# Patient Record
Sex: Female | Born: 1935
Health system: Southern US, Community
[De-identification: ages and names within clinical notes are randomized; demographics above are authoritative.]

## PROBLEM LIST (undated history)

## (undated) DIAGNOSIS — R002 Palpitations: Secondary | ICD-10-CM

## (undated) DIAGNOSIS — K219 Gastro-esophageal reflux disease without esophagitis: Secondary | ICD-10-CM

## (undated) DIAGNOSIS — I480 Paroxysmal atrial fibrillation: Secondary | ICD-10-CM

## (undated) DIAGNOSIS — I4891 Unspecified atrial fibrillation: Secondary | ICD-10-CM

## (undated) DIAGNOSIS — I499 Cardiac arrhythmia, unspecified: Secondary | ICD-10-CM

## (undated) DIAGNOSIS — C801 Malignant (primary) neoplasm, unspecified: Secondary | ICD-10-CM

## (undated) DIAGNOSIS — H9190 Unspecified hearing loss, unspecified ear: Secondary | ICD-10-CM

## (undated) DIAGNOSIS — R918 Other nonspecific abnormal finding of lung field: Secondary | ICD-10-CM

## (undated) HISTORY — PX: ABDOMINAL HYSTERECTOMY: SHX81

---

## 2003-11-16 ENCOUNTER — Ambulatory Visit: Payer: Self-pay | Admitting: Unknown Physician Specialty

## 2004-08-15 ENCOUNTER — Ambulatory Visit: Payer: Self-pay | Admitting: Gastroenterology

## 2004-11-26 ENCOUNTER — Ambulatory Visit: Payer: Self-pay | Admitting: Unknown Physician Specialty

## 2004-12-04 ENCOUNTER — Ambulatory Visit: Payer: Self-pay | Admitting: Unknown Physician Specialty

## 2005-06-17 ENCOUNTER — Ambulatory Visit: Payer: Self-pay | Admitting: Internal Medicine

## 2005-06-26 ENCOUNTER — Ambulatory Visit: Payer: Self-pay | Admitting: Unknown Physician Specialty

## 2005-12-10 ENCOUNTER — Ambulatory Visit: Payer: Self-pay | Admitting: Internal Medicine

## 2006-11-26 ENCOUNTER — Ambulatory Visit: Payer: Self-pay | Admitting: Internal Medicine

## 2006-12-17 ENCOUNTER — Ambulatory Visit: Payer: Self-pay | Admitting: Internal Medicine

## 2007-01-13 ENCOUNTER — Ambulatory Visit: Payer: Self-pay | Admitting: Gastroenterology

## 2008-01-05 ENCOUNTER — Ambulatory Visit: Payer: Self-pay | Admitting: Internal Medicine

## 2008-03-30 ENCOUNTER — Ambulatory Visit: Payer: Self-pay | Admitting: Unknown Physician Specialty

## 2008-05-04 ENCOUNTER — Ambulatory Visit: Payer: Self-pay | Admitting: Pain Medicine

## 2008-05-05 ENCOUNTER — Ambulatory Visit: Payer: Self-pay | Admitting: Pain Medicine

## 2008-05-24 ENCOUNTER — Ambulatory Visit: Payer: Self-pay | Admitting: Physician Assistant

## 2009-01-10 ENCOUNTER — Ambulatory Visit: Payer: Self-pay | Admitting: Internal Medicine

## 2009-05-16 ENCOUNTER — Ambulatory Visit: Payer: Self-pay | Admitting: Internal Medicine

## 2010-02-19 ENCOUNTER — Ambulatory Visit: Payer: Self-pay | Admitting: Internal Medicine

## 2010-07-03 ENCOUNTER — Ambulatory Visit: Payer: Self-pay | Admitting: Internal Medicine

## 2010-07-13 ENCOUNTER — Ambulatory Visit: Payer: Self-pay | Admitting: Internal Medicine

## 2010-08-12 ENCOUNTER — Ambulatory Visit: Payer: Self-pay | Admitting: Internal Medicine

## 2010-09-12 ENCOUNTER — Ambulatory Visit: Payer: Self-pay | Admitting: Internal Medicine

## 2011-03-26 ENCOUNTER — Ambulatory Visit: Payer: Self-pay | Admitting: Internal Medicine

## 2011-03-29 ENCOUNTER — Ambulatory Visit: Payer: Self-pay | Admitting: Internal Medicine

## 2011-04-03 ENCOUNTER — Ambulatory Visit: Payer: Self-pay | Admitting: Internal Medicine

## 2012-03-31 ENCOUNTER — Ambulatory Visit: Payer: Self-pay | Admitting: Internal Medicine

## 2013-04-19 ENCOUNTER — Ambulatory Visit: Payer: Self-pay | Admitting: Physician Assistant

## 2013-05-04 ENCOUNTER — Ambulatory Visit: Payer: Self-pay | Admitting: Physician Assistant

## 2014-04-19 DIAGNOSIS — K219 Gastro-esophageal reflux disease without esophagitis: Secondary | ICD-10-CM | POA: Diagnosis not present

## 2014-04-19 DIAGNOSIS — Z0001 Encounter for general adult medical examination with abnormal findings: Secondary | ICD-10-CM | POA: Diagnosis not present

## 2014-04-19 DIAGNOSIS — R300 Dysuria: Secondary | ICD-10-CM | POA: Diagnosis not present

## 2014-05-10 DIAGNOSIS — Z1231 Encounter for screening mammogram for malignant neoplasm of breast: Secondary | ICD-10-CM | POA: Diagnosis not present

## 2014-05-26 DIAGNOSIS — R319 Hematuria, unspecified: Secondary | ICD-10-CM | POA: Diagnosis not present

## 2014-07-05 DIAGNOSIS — H2513 Age-related nuclear cataract, bilateral: Secondary | ICD-10-CM | POA: Diagnosis not present

## 2014-09-30 ENCOUNTER — Emergency Department
Admission: EM | Admit: 2014-09-30 | Discharge: 2014-09-30 | Disposition: A | Discharge status: Home / Self Care | Payer: Commercial Managed Care - HMO | Attending: Emergency Medicine | Admitting: Emergency Medicine

## 2014-09-30 ENCOUNTER — Encounter: Payer: Self-pay | Admitting: Emergency Medicine

## 2014-09-30 DIAGNOSIS — Z87891 Personal history of nicotine dependence: Secondary | ICD-10-CM | POA: Diagnosis not present

## 2014-09-30 DIAGNOSIS — J029 Acute pharyngitis, unspecified: Secondary | ICD-10-CM | POA: Diagnosis not present

## 2014-09-30 HISTORY — DX: Gastro-esophageal reflux disease without esophagitis: K21.9

## 2014-09-30 LAB — POCT RAPID STREP A: STREPTOCOCCUS, GROUP A SCREEN (DIRECT): NEGATIVE

## 2014-09-30 MED ORDER — LIDOCAINE VISCOUS 2 % MT SOLN
15.0000 mL | Freq: Once | OROMUCOSAL | Status: AC
  Administered 2014-09-30: 15 mL via OROMUCOSAL
  Filled 2014-09-30: qty 15

## 2014-09-30 MED ORDER — MAGIC MOUTHWASH W/LIDOCAINE: 5.0000 mL | Freq: Four times a day (QID) | ORAL | Status: DC | PRN

## 2014-09-30 NOTE — Discharge Instructions (Signed)
Pharyngitis Pharyngitis is redness, pain, and swelling (inflammation) of your pharynx.  CAUSES  Pharyngitis is usually caused by infection. Most of the time, these infections are from viruses (viral) and are part of a cold. However, sometimes pharyngitis is caused by bacteria (bacterial). Pharyngitis can also be caused by allergies. Viral pharyngitis may be spread from person to person by coughing, sneezing, and personal items or utensils (cups, forks, spoons, toothbrushes). Bacterial pharyngitis may be spread from person to person by more intimate contact, such as kissing.  SIGNS AND SYMPTOMS  Symptoms of pharyngitis include:   Sore throat.   Tiredness (fatigue).   Low-grade fever.   Headache.  Joint pain and muscle aches.  Skin rashes.  Swollen lymph nodes.  Plaque-like film on throat or tonsils (often seen with bacterial pharyngitis). DIAGNOSIS  Your health care provider will ask you questions about your illness and your symptoms. Your medical history, along with a physical exam, is often all that is needed to diagnose pharyngitis. Sometimes, a rapid strep test is done. Other lab tests may also be done, depending on the suspected cause.  TREATMENT  Viral pharyngitis will usually get better in 3-4 days without the use of medicine. Bacterial pharyngitis is treated with medicines that kill germs (antibiotics).  HOME CARE INSTRUCTIONS   Drink enough water and fluids to keep your urine clear or pale yellow.   Only take over-the-counter or prescription medicines as directed by your health care provider:   If you are prescribed antibiotics, make sure you finish them even if you start to feel better.   Do not take aspirin.   Get lots of rest.   Gargle with 8 oz of salt water ( tsp of salt per 1 qt of water) as often as every 1-2 hours to soothe your throat.   Throat lozenges (if you are not at risk for choking) or sprays may be used to soothe your throat. SEEK MEDICAL  CARE IF:   You have large, tender lumps in your neck.  You have a rash.  You cough up green, yellow-brown, or bloody spit. SEEK IMMEDIATE MEDICAL CARE IF:   Your neck becomes stiff.  You drool or are unable to swallow liquids.  You vomit or are unable to keep medicines or liquids down.  You have severe pain that does not go away with the use of recommended medicines.  You have trouble breathing (not caused by a stuffy nose). MAKE SURE YOU:   Understand these instructions.  Will watch your condition.  Will get help right away if you are not doing well or get worse. Document Released: 01/28/2005 Document Revised: 11/18/2012 Document Reviewed: 10/05/2012 [facility] Patient Information 2015 [place], [place]. This information is not intended to replace advice given to you by your health care provider. Make sure you discuss any questions you have with your health care provider.  Your rapid strep test was normal today. Use the prescription mouthwash as needed. Follow-up with your provider as needed. Consider dosing your daily allergy medicine to reduce post-nasal drainage.

## 2014-09-30 NOTE — ED Provider Notes (Signed)
[facility] Emergency Department Provider Note ____________________________________________  Time seen: 1825  I have reviewed the triage vital signs and the nursing notes.  HISTORY  Chief Complaint  Sore Throat  HPI Carrie Rodriguez is a 81 y.o. female reports to the ED for evaluation of a 3 day complaint of sore throat pain that has worsened over the last 2 days.She denies fevers, chills, sweats, or URI symptoms. She was initially evaluated at the [facility] clinic, but was sent here after a negative strep test.  Past Medical History  Diagnosis Date  . GERD (gastroesophageal reflux disease)    There are no active problems to display for this patient.  History reviewed. No pertinent past surgical history.  Current Outpatient Rx  Name  Route  Sig  Dispense  Refill  . Alum & Mag Hydroxide-Simeth (MAGIC MOUTHWASH W/LIDOCAINE) SOLN   Oral   Take 5 mLs by mouth 4 (four) times daily as needed for mouth pain.   150 mL   0     Prepare [facility] Magic Mouthwash formula with lidoca ...    Allergies Review of patient's allergies indicates no known allergies.  No family history on file.  Social History Social History  Substance Use Topics  . Smoking status: Former Research scientist (life sciences)  . Smokeless tobacco: None  . Alcohol Use: None   Review of Systems  Constitutional: Negative for fever. Eyes: Negative for visual changes. ENT: Positive for sore throat. Cardiovascular: Negative for chest pain. Respiratory: Negative for shortness of breath. Gastrointestinal: Negative for abdominal pain, vomiting and diarrhea. Genitourinary: Negative for dysuria. Musculoskeletal: Negative for back pain. Skin: Negative for rash. Neurological: Negative for headaches, focal weakness or numbness. ____________________________________________  PHYSICAL EXAM:  VITAL SIGNS: ED Triage Vitals  Enc Vitals Group     BP 09/30/14 1558 142/52 mmHg     Pulse Rate 09/30/14 1558 81     Resp  09/30/14 1558 18     Temp 09/30/14 1558 98.3 F (36.8 C)     Temp Source 09/30/14 1558 Oral     SpO2 09/30/14 1558 96 %     Weight 09/30/14 1558 128 lb (58.06 kg)     Height 09/30/14 1558 5\' 4"  (1.626 m)     Head Cir --      Peak Flow --      Pain Score 09/30/14 1603 10     Pain Loc --      Pain Edu? --      Excl. in [place]? --    Constitutional: Alert and oriented. Well appearing and in no distress. Eyes: Conjunctivae are normal. PERRL. Normal extraocular movements. ENT   Head: Normocephalic and atraumatic.   Nose: No congestion/rhinnorhea.   Mouth/Throat: Mucous membranes are moist. Uvula midline. Tonsils not visualized. Posterior pharynx injected, with cobblestone appearance.    Neck: Supple. No thyromegaly. Hematological/Lymphatic/Immunilogical: No cervical lymphadenopathy. Cardiovascular: Normal rate, regular rhythm.  Respiratory: Normal respiratory effort. No wheezes/rales/rhonchi. Gastrointestinal: Soft and nontender. No distention. Musculoskeletal: Nontender with normal range of motion in all extremities.  Neurologic:  Normal gait without ataxia. Normal speech and language. No gross focal neurologic deficits are appreciated. Skin:  Skin is warm, dry and intact. No rash noted. Psychiatric: Mood and affect are normal. Patient exhibits appropriate insight and judgment. ____________________________________________   LABS (pertinent positives/negatives) Labs Reviewed  POCT RAPID STREP A  ____________________________________________  PROCEDURES  2% viscous lido gargle ____________________________________________  INITIAL IMPRESSION / ASSESSMENT AND PLAN / ED COURSE  Acute pharyngitis. No clinical evidence  of tonsillitis or visible oral ulcers. Will provide magic mouthwash prescription. Suggest follow-up with primary provider or return for worsening symptoms. Patient reports decreased mouth pain following lidocaine gargle.   ____________________________________________  FINAL CLINICAL IMPRESSION(S) / ED DIAGNOSES  Final diagnoses:  Sore throat     Melvenia Needles, PA-C 09/30/14 2001  Hinda Kehr, MD 10/02/14 0002

## 2014-09-30 NOTE — ED Notes (Addendum)
Worsening throat pain x2 days, worsening pain when drinking , " it feels like my throat is on fire" , hx of mouth ulcers , was seen and evaluated at [facility] prior to coming here

## 2014-10-04 ENCOUNTER — Encounter: Payer: Self-pay | Admitting: Emergency Medicine

## 2014-10-04 ENCOUNTER — Emergency Department: Payer: Commercial Managed Care - HMO

## 2014-10-04 ENCOUNTER — Other Ambulatory Visit: Payer: Self-pay

## 2014-10-04 ENCOUNTER — Emergency Department
Admission: EM | Admit: 2014-10-04 | Discharge: 2014-10-04 | Disposition: A | Discharge status: Home / Self Care | Payer: Commercial Managed Care - HMO | Attending: Emergency Medicine | Admitting: Emergency Medicine

## 2014-10-04 DIAGNOSIS — R0602 Shortness of breath: Secondary | ICD-10-CM | POA: Diagnosis not present

## 2014-10-04 DIAGNOSIS — J209 Acute bronchitis, unspecified: Secondary | ICD-10-CM | POA: Diagnosis not present

## 2014-10-04 DIAGNOSIS — J029 Acute pharyngitis, unspecified: Secondary | ICD-10-CM | POA: Diagnosis not present

## 2014-10-04 DIAGNOSIS — J449 Chronic obstructive pulmonary disease, unspecified: Secondary | ICD-10-CM | POA: Diagnosis not present

## 2014-10-04 DIAGNOSIS — R0600 Dyspnea, unspecified: Secondary | ICD-10-CM | POA: Diagnosis not present

## 2014-10-04 DIAGNOSIS — R05 Cough: Secondary | ICD-10-CM | POA: Diagnosis not present

## 2014-10-04 DIAGNOSIS — R918 Other nonspecific abnormal finding of lung field: Secondary | ICD-10-CM | POA: Diagnosis not present

## 2014-10-04 DIAGNOSIS — J40 Bronchitis, not specified as acute or chronic: Secondary | ICD-10-CM

## 2014-10-04 DIAGNOSIS — J984 Other disorders of lung: Secondary | ICD-10-CM | POA: Diagnosis not present

## 2014-10-04 LAB — CBC WITH DIFFERENTIAL/PLATELET
BASOS PCT: 0 %
Basophils Absolute: 0 10*3/uL (ref 0–0.1)
EOS ABS: 0 10*3/uL (ref 0–0.7)
Eosinophils Relative: 0 %
HEMATOCRIT: 39.1 % (ref 35.0–47.0)
HEMOGLOBIN: 12.9 g/dL (ref 12.0–16.0)
LYMPHS ABS: 1.5 10*3/uL (ref 1.0–3.6)
Lymphocytes Relative: 29 %
MCH: 28.6 pg (ref 26.0–34.0)
MCHC: 33.1 g/dL (ref 32.0–36.0)
MCV: 86.4 fL (ref 80.0–100.0)
MONOS PCT: 17 %
Monocytes Absolute: 0.9 10*3/uL (ref 0.2–0.9)
NEUTROS ABS: 2.8 10*3/uL (ref 1.4–6.5)
NEUTROS PCT: 54 %
Platelets: 140 10*3/uL — ABNORMAL LOW (ref 150–440)
RBC: 4.53 MIL/uL (ref 3.80–5.20)
RDW: 13.9 % (ref 11.5–14.5)
WBC: 5.2 10*3/uL (ref 3.6–11.0)

## 2014-10-04 LAB — TROPONIN I: TROPONIN I: 0.04 ng/mL — AB (ref <0.031)

## 2014-10-04 LAB — COMPREHENSIVE METABOLIC PANEL
ALT: 23 U/L (ref 14–54)
ANION GAP: 13 (ref 5–15)
AST: 40 U/L (ref 15–41)
Albumin: 3.9 g/dL (ref 3.5–5.0)
Alkaline Phosphatase: 55 U/L (ref 38–126)
BUN: 27 mg/dL — ABNORMAL HIGH (ref 6–20)
CHLORIDE: 100 mmol/L — AB (ref 101–111)
CO2: 23 mmol/L (ref 22–32)
CREATININE: 1.28 mg/dL — AB (ref 0.44–1.00)
Calcium: 9.1 mg/dL (ref 8.9–10.3)
GFR, EST AFRICAN AMERICAN: 45 mL/min — AB (ref >60)
GFR, EST NON AFRICAN AMERICAN: 39 mL/min — AB (ref >60)
Glucose, Bld: 87 mg/dL (ref 65–99)
POTASSIUM: 3.4 mmol/L — AB (ref 3.5–5.1)
SODIUM: 136 mmol/L (ref 135–145)
Total Bilirubin: 0.5 mg/dL (ref 0.3–1.2)
Total Protein: 7.4 g/dL (ref 6.5–8.1)

## 2014-10-04 LAB — BRAIN NATRIURETIC PEPTIDE: B NATRIURETIC PEPTIDE 5: 83 pg/mL (ref 0.0–100.0)

## 2014-10-04 MED ORDER — PREDNISONE 10 MG PO TABS: 10.0000 mg | ORAL_TABLET | Freq: Every day | ORAL | Status: DC

## 2014-10-04 MED ORDER — IOHEXOL 300 MG/ML  SOLN
60.0000 mL | Freq: Once | INTRAMUSCULAR | Status: AC | PRN
  Administered 2014-10-04: 60 mL via INTRAVENOUS

## 2014-10-04 MED ORDER — ALBUTEROL SULFATE HFA 108 (90 BASE) MCG/ACT IN AERS: 2.0000 | INHALATION_SPRAY | Freq: Four times a day (QID) | RESPIRATORY_TRACT | Status: DC | PRN

## 2014-10-04 MED ORDER — LEVOFLOXACIN 750 MG PO TABS: 750.0000 mg | ORAL_TABLET | Freq: Every day | ORAL | Status: DC

## 2014-10-04 MED ORDER — HYDROCOD POLST-CPM POLST ER 10-8 MG/5ML PO SUER
5.0000 mL | Freq: Once | ORAL | Status: AC
  Administered 2014-10-04: 5 mL via ORAL
  Filled 2014-10-04: qty 5

## 2014-10-04 MED ORDER — PREDNISONE 20 MG PO TABS
40.0000 mg | ORAL_TABLET | Freq: Once | ORAL | Status: AC
  Administered 2014-10-04: 40 mg via ORAL
  Filled 2014-10-04: qty 2

## 2014-10-04 MED ORDER — LEVOFLOXACIN 750 MG PO TABS
750.0000 mg | ORAL_TABLET | Freq: Once | ORAL | Status: AC
  Administered 2014-10-04: 750 mg via ORAL
  Filled 2014-10-04: qty 1

## 2014-10-04 MED ORDER — HYDROCOD POLST-CPM POLST ER 10-8 MG/5ML PO SUER: 5.0000 mL | Freq: Two times a day (BID) | ORAL | Status: DC

## 2014-10-04 NOTE — Discharge Instructions (Signed)
Pharyngitis Pharyngitis is redness, pain, and swelling (inflammation) of your pharynx.  CAUSES  Pharyngitis is usually caused by infection. Most of the time, these infections are from viruses (viral) and are part of a cold. However, sometimes pharyngitis is caused by bacteria (bacterial). Pharyngitis can also be caused by allergies. Viral pharyngitis may be spread from person to person by coughing, sneezing, and personal items or utensils (cups, forks, spoons, toothbrushes). Bacterial pharyngitis may be spread from person to person by more intimate contact, such as kissing.  SIGNS AND SYMPTOMS  Symptoms of pharyngitis include:   Sore throat.   Tiredness (fatigue).   Low-grade fever.   Headache.  Joint pain and muscle aches.  Skin rashes.  Swollen lymph nodes.  Plaque-like film on throat or tonsils (often seen with bacterial pharyngitis). DIAGNOSIS  Your health care provider will ask you questions about your illness and your symptoms. Your medical history, along with a physical exam, is often all that is needed to diagnose pharyngitis. Sometimes, a rapid strep test is done. Other lab tests may also be done, depending on the suspected cause.  TREATMENT  Viral pharyngitis will usually get better in 3-4 days without the use of medicine. Bacterial pharyngitis is treated with medicines that kill germs (antibiotics).  HOME CARE INSTRUCTIONS   Drink enough water and fluids to keep your urine clear or pale yellow.   Only take over-the-counter or prescription medicines as directed by your health care provider:   If you are prescribed antibiotics, make sure you finish them even if you start to feel better.   Do not take aspirin.   Get lots of rest.   Gargle with 8 oz of salt water ( tsp of salt per 1 qt of water) as often as every 1-2 hours to soothe your throat.   Throat lozenges (if you are not at risk for choking) or sprays may be used to soothe your throat. SEEK  MEDICAL CARE IF:   You have large, tender lumps in your neck.  You have a rash.  You cough up green, yellow-brown, or bloody spit. SEEK IMMEDIATE MEDICAL CARE IF:   Your neck becomes stiff.  You drool or are unable to swallow liquids.  You vomit or are unable to keep medicines or liquids down.  You have severe pain that does not go away with the use of recommended medicines.  You have trouble breathing (not caused by a stuffy nose). MAKE SURE YOU:   Understand these instructions.  Will watch your condition.  Will get help right away if you are not doing well or get worse. Document Released: 01/28/2005 Document Revised: 11/18/2012 Document Reviewed: 10/05/2012 [facility] Patient Information 2015 [place], [place]. This information is not intended to replace advice given to you by your health care provider. Make sure you discuss any questions you have with your health care provider. Acute Bronchitis Bronchitis is inflammation of the airways that extend from the windpipe into the lungs (bronchi). The inflammation often causes mucus to develop. This leads to a cough, which is the most common symptom of bronchitis.  In acute bronchitis, the condition usually develops suddenly and goes away over time, usually in a couple weeks. Smoking, allergies, and asthma can make bronchitis worse. Repeated episodes of bronchitis may cause further lung problems.  CAUSES Acute bronchitis is most often caused by the same virus that causes a cold. The virus can spread from person to person (contagious) through coughing, sneezing, and touching contaminated objects. SIGNS AND SYMPTOMS  Cough.   Fever.   Coughing up mucus.   Body aches.   Chest congestion.   Chills.   Shortness of breath.   Sore throat.  DIAGNOSIS  Acute bronchitis is usually diagnosed through a physical exam. Your health care provider will also ask you questions about your medical history. Tests, such as chest X-rays,  are sometimes done to rule out other conditions.  TREATMENT  Acute bronchitis usually goes away in a couple weeks. Oftentimes, no medical treatment is necessary. Medicines are sometimes given for relief of fever or cough. Antibiotic medicines are usually not needed but may be prescribed in certain situations. In some cases, an inhaler may be recommended to help reduce shortness of breath and control the cough. A cool mist vaporizer may also be used to help thin bronchial secretions and make it easier to clear the chest.  HOME CARE INSTRUCTIONS  Get plenty of rest.   Drink enough fluids to keep your urine clear or pale yellow (unless you have a medical condition that requires fluid restriction). Increasing fluids may help thin your respiratory secretions (sputum) and reduce chest congestion, and it will prevent dehydration.   Take medicines only as directed by your health care provider.  If you were prescribed an antibiotic medicine, finish it all even if you start to feel better.  Avoid smoking and secondhand smoke. Exposure to cigarette smoke or irritating chemicals will make bronchitis worse. If you are a smoker, consider using nicotine gum or skin patches to help control withdrawal symptoms. Quitting smoking will help your lungs heal faster.   Reduce the chances of another bout of acute bronchitis by washing your hands frequently, avoiding people with cold symptoms, and trying not to touch your hands to your mouth, nose, or eyes.   Keep all follow-up visits as directed by your health care provider.  SEEK MEDICAL CARE IF: Your symptoms do not improve after 1 week of treatment.  SEEK IMMEDIATE MEDICAL CARE IF:  You develop an increased fever or chills.   You have chest pain.   You have severe shortness of breath.  You have bloody sputum.   You develop dehydration.  You faint or repeatedly feel like you are going to pass out.  You develop repeated vomiting.  You develop a  severe headache. MAKE SURE YOU:   Understand these instructions.  Will watch your condition.  Will get help right away if you are not doing well or get worse. Document Released: 03/07/2004 Document Revised: 06/14/2013 Document Reviewed: 07/21/2012 [facility] Patient Information 2015 [place], [place]. This information is not intended to replace advice given to you by your health care provider. Make sure you discuss any questions you have with your health care provider.

## 2014-10-04 NOTE — ED Provider Notes (Signed)
[facility] Emergency Department Provider Note     Time seen: ----------------------------------------- 1:26 PM on 10/04/2014 -----------------------------------------    I have reviewed the triage vital signs and the nursing notes.   HISTORY  Chief Complaint Sore Throat    HPI Carrie Rodriguez is a 81 y.o. female who presents ER with sore throat and cough for the last week. Patient states it hurts to eat, she is concerned about dehydration. Patient complains of significant cough and shortness of breath. Nothing has made the symptoms better or worse, was seen here recently for same without any improvement. Patient denies any lung problems.   Past Medical History  Diagnosis Date  . GERD (gastroesophageal reflux disease)     There are no active problems to display for this patient.   History reviewed. No pertinent past surgical history.  Allergies Review of patient's allergies indicates no known allergies.  Social History Social History  Substance Use Topics  . Smoking status: Never Smoker   . Smokeless tobacco: None  . Alcohol Use: No    Review of Systems Constitutional: Negative for fever. Eyes: Negative for visual changes. ENT: Positive for sore throat Cardiovascular: Negative for chest pain. Respiratory: Positive for shortness of breath and cough Gastrointestinal: Negative for abdominal pain, vomiting and diarrhea. Genitourinary: Negative for dysuria. Musculoskeletal: Negative for back pain. Skin: Negative for rash. Neurological: Negative for headaches, focal weakness or numbness.  10-point ROS otherwise negative.  ____________________________________________   PHYSICAL EXAM:  VITAL SIGNS: ED Triage Vitals  Enc Vitals Group     BP --      Pulse --      Resp --      Temp --      Temp src --      SpO2 --      Weight --      Height --      Head Cir --      Peak Flow --      Pain Score 10/04/14 1139 8     Pain Loc --       Pain Edu? --      Excl. in [place]? --     Constitutional: Alert and oriented. Mildly anxious Eyes: Conjunctivae are normal. PERRL. Normal extraocular movements. ENT   Head: Normocephalic and atraumatic.   Nose: No congestion/rhinnorhea.   Mouth/Throat: Mucous membranes are moist.   Neck: No stridor. Cardiovascular: Normal rate, regular rhythm. Normal and symmetric distal pulses are present in all extremities. No murmurs, rubs, or gallops. Respiratory: Mild tachypnea, Breath sounds are clear and equal bilaterally. No wheezes/rales/rhonchi. Gastrointestinal: Soft and nontender. No distention. No abdominal bruits.  Musculoskeletal: Nontender with normal range of motion in all extremities. No joint effusions.  No lower extremity tenderness nor edema. Neurologic:  Normal speech and language. No gross focal neurologic deficits are appreciated. Speech is normal. No gait instability. Skin:  Skin is warm, dry and intact. No rash noted. Psychiatric: Mood and affect are normal. Speech and behavior are normal. Patient exhibits appropriate insight and judgment. ____________________________________________  EKG: Interpreted by me. Normal sinus rhythm with sinus arrhythmia with a rate of 89 bpm, frequent PVCs, nonspecific T wave changes, normal axis.  ____________________________________________  ED COURSE:  Pertinent labs & imaging results that were available during my care of the patient were reviewed by me and considered in my medical decision making (see chart for details). Patient with a chest x-ray and basic labs.  Initial chest x-ray reveals possible right upper lobe  mass. We'll obtain CT of the chest ____________________________________________    LABS (pertinent positives/negatives)  Labs Reviewed  COMPREHENSIVE METABOLIC PANEL - Abnormal; Notable for the following:    Potassium 3.4 (*)    Chloride 100 (*)    BUN 27 (*)    Creatinine, Ser 1.28 (*)    GFR calc non Af  Amer 39 (*)    GFR calc Af Amer 45 (*)    All other components within normal limits  CBC WITH DIFFERENTIAL/PLATELET - Abnormal; Notable for the following:    Platelets 140 (*)    All other components within normal limits  TROPONIN I - Abnormal; Notable for the following:    Troponin I 0.04 (*)    All other components within normal limits  BRAIN NATRIURETIC PEPTIDE    RADIOLOGY Images were viewed by me  Chest x-ray IMPRESSION: COPD changes with BILATERAL upper lobe volume loss and asymmetric opacity at lateral RIGHT apex, potentially scarring but tumor not excluded ; CT chest with contrast recommended for further evaluation. CT chest IMPRESSION: Numerous hepatic cysts.  Two minimally enlarged mediastinal lymph nodes, nonspecific.  Bronchitic changes with biapical pleural-parenchymal scarring asymmetrically greater on RIGHT, accounting for chest radiograph findings.  Scattered ground-glass opacities in the upper lobes bilaterally greater on RIGHT ; initial follow-up by chest CT without contrast is recommended in 3 months to confirm persistence.  This recommendation follows the consensus statement: Recommendations for the Management of Subsolid Pulmonary Nodules Detected at CT: A Statement from the [place] as published in Radiology 2013; 266:304-317. ____________________________________________  FINAL ASSESSMENT AND PLAN  Pharyngitis, bronchitis   Plan: Patient with labs and imaging as dictated above. I do not think that the troponin 0.04 is significant. She appears to have bronchitis, is coughing frequently in the room without signs of heart failure or other worrisome etiology. CT scan with contrast reveals bronchitic changes which is probably the etiology for her cough. Same virus is likely cause pharyngitis. She is stable follow-up with her doctor in 2 days for recheck. Will discharge them antibodies, albuterol and steroids.   Earleen Newport,  MD   Earleen Newport, MD 10/04/14 [phone]

## 2014-10-04 NOTE — ED Notes (Signed)
Reports sore throat and cough x 1 wk.  Hurts to eat, worried about dehydration.  Mask applied

## 2014-10-04 NOTE — ED Notes (Signed)
Disregard departure condition put in at 1353

## 2015-04-05 ENCOUNTER — Other Ambulatory Visit: Payer: Self-pay | Admitting: Physician Assistant

## 2015-04-05 DIAGNOSIS — R634 Abnormal weight loss: Secondary | ICD-10-CM | POA: Diagnosis not present

## 2015-04-05 DIAGNOSIS — R1312 Dysphagia, oropharyngeal phase: Secondary | ICD-10-CM | POA: Diagnosis not present

## 2015-04-05 DIAGNOSIS — J029 Acute pharyngitis, unspecified: Secondary | ICD-10-CM | POA: Diagnosis not present

## 2015-04-05 DIAGNOSIS — K219 Gastro-esophageal reflux disease without esophagitis: Secondary | ICD-10-CM | POA: Diagnosis not present

## 2015-04-05 DIAGNOSIS — R1310 Dysphagia, unspecified: Secondary | ICD-10-CM

## 2015-04-06 DIAGNOSIS — R1311 Dysphagia, oral phase: Secondary | ICD-10-CM | POA: Diagnosis not present

## 2015-04-06 DIAGNOSIS — Z0001 Encounter for general adult medical examination with abnormal findings: Secondary | ICD-10-CM | POA: Diagnosis not present

## 2015-04-06 DIAGNOSIS — R634 Abnormal weight loss: Secondary | ICD-10-CM | POA: Diagnosis not present

## 2015-04-14 ENCOUNTER — Ambulatory Visit
Admission: RE | Admit: 2015-04-14 | Discharge: 2015-04-14 | Disposition: A | Discharge status: Home / Self Care | Payer: Commercial Managed Care - HMO | Source: Ambulatory Visit | Attending: Physician Assistant | Admitting: Physician Assistant

## 2015-04-14 DIAGNOSIS — K449 Diaphragmatic hernia without obstruction or gangrene: Secondary | ICD-10-CM | POA: Diagnosis not present

## 2015-04-14 DIAGNOSIS — R1310 Dysphagia, unspecified: Secondary | ICD-10-CM

## 2015-04-14 DIAGNOSIS — M2570 Osteophyte, unspecified joint: Secondary | ICD-10-CM | POA: Insufficient documentation

## 2015-04-25 ENCOUNTER — Other Ambulatory Visit: Payer: Self-pay | Admitting: Nurse Practitioner

## 2015-04-25 DIAGNOSIS — R1312 Dysphagia, oropharyngeal phase: Secondary | ICD-10-CM | POA: Diagnosis not present

## 2015-04-25 DIAGNOSIS — Z1231 Encounter for screening mammogram for malignant neoplasm of breast: Secondary | ICD-10-CM

## 2015-04-25 DIAGNOSIS — E048 Other specified nontoxic goiter: Secondary | ICD-10-CM

## 2015-04-25 DIAGNOSIS — J309 Allergic rhinitis, unspecified: Secondary | ICD-10-CM | POA: Diagnosis not present

## 2015-04-25 DIAGNOSIS — Z0001 Encounter for general adult medical examination with abnormal findings: Secondary | ICD-10-CM | POA: Diagnosis not present

## 2015-04-25 DIAGNOSIS — F411 Generalized anxiety disorder: Secondary | ICD-10-CM | POA: Diagnosis not present

## 2015-04-28 ENCOUNTER — Ambulatory Visit
Admission: RE | Admit: 2015-04-28 | Discharge: 2015-04-28 | Disposition: A | Discharge status: Home / Self Care | Payer: Commercial Managed Care - HMO | Source: Ambulatory Visit | Attending: Nurse Practitioner | Admitting: Nurse Practitioner

## 2015-04-28 DIAGNOSIS — E042 Nontoxic multinodular goiter: Secondary | ICD-10-CM | POA: Diagnosis not present

## 2015-04-28 DIAGNOSIS — E048 Other specified nontoxic goiter: Secondary | ICD-10-CM | POA: Diagnosis not present

## 2015-04-28 DIAGNOSIS — R591 Generalized enlarged lymph nodes: Secondary | ICD-10-CM | POA: Diagnosis not present

## 2015-05-24 ENCOUNTER — Other Ambulatory Visit: Payer: Self-pay | Admitting: Nurse Practitioner

## 2015-05-24 ENCOUNTER — Ambulatory Visit
Admission: RE | Admit: 2015-05-24 | Discharge: 2015-05-24 | Disposition: A | Discharge status: Home / Self Care | Payer: Commercial Managed Care - HMO | Source: Ambulatory Visit | Attending: Nurse Practitioner | Admitting: Nurse Practitioner

## 2015-05-24 DIAGNOSIS — Z1231 Encounter for screening mammogram for malignant neoplasm of breast: Secondary | ICD-10-CM | POA: Diagnosis not present

## 2015-05-29 DIAGNOSIS — R1312 Dysphagia, oropharyngeal phase: Secondary | ICD-10-CM | POA: Diagnosis not present

## 2015-05-29 DIAGNOSIS — E048 Other specified nontoxic goiter: Secondary | ICD-10-CM | POA: Diagnosis not present

## 2015-05-29 DIAGNOSIS — J309 Allergic rhinitis, unspecified: Secondary | ICD-10-CM | POA: Diagnosis not present

## 2015-07-18 DIAGNOSIS — J309 Allergic rhinitis, unspecified: Secondary | ICD-10-CM | POA: Diagnosis not present

## 2015-07-18 DIAGNOSIS — J329 Chronic sinusitis, unspecified: Secondary | ICD-10-CM | POA: Diagnosis not present

## 2015-07-25 DIAGNOSIS — F411 Generalized anxiety disorder: Secondary | ICD-10-CM | POA: Diagnosis not present

## 2015-07-25 DIAGNOSIS — J329 Chronic sinusitis, unspecified: Secondary | ICD-10-CM | POA: Diagnosis not present

## 2015-07-25 DIAGNOSIS — R634 Abnormal weight loss: Secondary | ICD-10-CM | POA: Diagnosis not present

## 2015-08-07 DIAGNOSIS — H5213 Myopia, bilateral: Secondary | ICD-10-CM | POA: Diagnosis not present

## 2016-06-10 ENCOUNTER — Other Ambulatory Visit: Payer: Self-pay | Admitting: Internal Medicine

## 2016-06-13 ENCOUNTER — Other Ambulatory Visit: Payer: Self-pay | Admitting: Internal Medicine

## 2016-06-13 DIAGNOSIS — Z1231 Encounter for screening mammogram for malignant neoplasm of breast: Secondary | ICD-10-CM

## 2016-06-17 ENCOUNTER — Ambulatory Visit
Admission: RE | Admit: 2016-06-17 | Discharge: 2016-06-17 | Disposition: A | Discharge status: Home / Self Care | Payer: Commercial Managed Care - HMO | Source: Ambulatory Visit | Attending: Internal Medicine | Admitting: Internal Medicine

## 2016-06-17 DIAGNOSIS — Z1231 Encounter for screening mammogram for malignant neoplasm of breast: Secondary | ICD-10-CM | POA: Diagnosis not present

## 2016-08-29 ENCOUNTER — Other Ambulatory Visit: Payer: Self-pay | Admitting: Internal Medicine

## 2016-08-29 DIAGNOSIS — R918 Other nonspecific abnormal finding of lung field: Secondary | ICD-10-CM | POA: Insufficient documentation

## 2016-08-29 DIAGNOSIS — Z0000 Encounter for general adult medical examination without abnormal findings: Secondary | ICD-10-CM | POA: Diagnosis not present

## 2016-08-29 DIAGNOSIS — I480 Paroxysmal atrial fibrillation: Secondary | ICD-10-CM | POA: Diagnosis not present

## 2016-08-29 DIAGNOSIS — E7800 Pure hypercholesterolemia, unspecified: Secondary | ICD-10-CM | POA: Insufficient documentation

## 2016-08-29 DIAGNOSIS — Z1211 Encounter for screening for malignant neoplasm of colon: Secondary | ICD-10-CM | POA: Diagnosis not present

## 2016-08-29 DIAGNOSIS — Z79899 Other long term (current) drug therapy: Secondary | ICD-10-CM | POA: Diagnosis not present

## 2016-08-29 DIAGNOSIS — K219 Gastro-esophageal reflux disease without esophagitis: Secondary | ICD-10-CM | POA: Diagnosis not present

## 2016-09-10 DIAGNOSIS — Z1211 Encounter for screening for malignant neoplasm of colon: Secondary | ICD-10-CM | POA: Diagnosis not present

## 2016-09-23 ENCOUNTER — Ambulatory Visit
Admission: RE | Admit: 2016-09-23 | Discharge: 2016-09-23 | Disposition: A | Discharge status: Home / Self Care | Payer: Commercial Managed Care - HMO | Source: Ambulatory Visit | Attending: Internal Medicine | Admitting: Internal Medicine

## 2016-09-23 DIAGNOSIS — I700 Atherosclerosis of aorta: Secondary | ICD-10-CM | POA: Diagnosis not present

## 2016-09-23 DIAGNOSIS — R918 Other nonspecific abnormal finding of lung field: Secondary | ICD-10-CM | POA: Insufficient documentation

## 2016-10-24 DIAGNOSIS — H2511 Age-related nuclear cataract, right eye: Secondary | ICD-10-CM | POA: Diagnosis not present

## 2017-03-03 DIAGNOSIS — I480 Paroxysmal atrial fibrillation: Secondary | ICD-10-CM | POA: Diagnosis not present

## 2017-03-03 DIAGNOSIS — Z79899 Other long term (current) drug therapy: Secondary | ICD-10-CM | POA: Diagnosis not present

## 2017-03-03 DIAGNOSIS — N390 Urinary tract infection, site not specified: Secondary | ICD-10-CM | POA: Diagnosis not present

## 2017-03-03 DIAGNOSIS — K219 Gastro-esophageal reflux disease without esophagitis: Secondary | ICD-10-CM | POA: Diagnosis not present

## 2017-03-03 DIAGNOSIS — E7800 Pure hypercholesterolemia, unspecified: Secondary | ICD-10-CM | POA: Diagnosis not present

## 2017-04-02 DIAGNOSIS — Z0000 Encounter for general adult medical examination without abnormal findings: Secondary | ICD-10-CM | POA: Diagnosis not present

## 2017-04-02 DIAGNOSIS — I6523 Occlusion and stenosis of bilateral carotid arteries: Secondary | ICD-10-CM | POA: Diagnosis not present

## 2017-04-02 DIAGNOSIS — I480 Paroxysmal atrial fibrillation: Secondary | ICD-10-CM | POA: Diagnosis not present

## 2017-04-02 DIAGNOSIS — E7800 Pure hypercholesterolemia, unspecified: Secondary | ICD-10-CM | POA: Diagnosis not present

## 2017-04-02 DIAGNOSIS — M542 Cervicalgia: Secondary | ICD-10-CM | POA: Diagnosis not present

## 2017-04-03 ENCOUNTER — Other Ambulatory Visit: Payer: Self-pay | Admitting: Internal Medicine

## 2017-04-03 DIAGNOSIS — M542 Cervicalgia: Secondary | ICD-10-CM

## 2017-04-03 DIAGNOSIS — I6523 Occlusion and stenosis of bilateral carotid arteries: Secondary | ICD-10-CM

## 2017-04-10 ENCOUNTER — Ambulatory Visit
Admission: RE | Admit: 2017-04-10 | Discharge: 2017-04-10 | Disposition: A | Discharge status: Home / Self Care | Payer: Medicare HMO | Source: Ambulatory Visit | Attending: Internal Medicine | Admitting: Internal Medicine

## 2017-04-10 DIAGNOSIS — I6523 Occlusion and stenosis of bilateral carotid arteries: Secondary | ICD-10-CM

## 2017-04-10 DIAGNOSIS — M542 Cervicalgia: Secondary | ICD-10-CM

## 2017-04-10 DIAGNOSIS — K219 Gastro-esophageal reflux disease without esophagitis: Secondary | ICD-10-CM | POA: Insufficient documentation

## 2017-05-29 DIAGNOSIS — R933 Abnormal findings on diagnostic imaging of other parts of digestive tract: Secondary | ICD-10-CM | POA: Diagnosis not present

## 2017-05-29 DIAGNOSIS — R1013 Epigastric pain: Secondary | ICD-10-CM | POA: Diagnosis not present

## 2017-06-03 ENCOUNTER — Other Ambulatory Visit: Payer: Self-pay | Admitting: Internal Medicine

## 2017-06-03 DIAGNOSIS — Z1231 Encounter for screening mammogram for malignant neoplasm of breast: Secondary | ICD-10-CM

## 2017-06-24 ENCOUNTER — Ambulatory Visit
Admission: RE | Admit: 2017-06-24 | Discharge: 2017-06-24 | Disposition: A | Discharge status: Home / Self Care | Payer: Medicare HMO | Source: Ambulatory Visit | Attending: Internal Medicine | Admitting: Internal Medicine

## 2017-06-24 DIAGNOSIS — Z1231 Encounter for screening mammogram for malignant neoplasm of breast: Secondary | ICD-10-CM | POA: Diagnosis not present

## 2017-07-10 ENCOUNTER — Encounter: Payer: Self-pay | Admitting: *Deleted

## 2017-07-11 ENCOUNTER — Ambulatory Visit
Admission: RE | Admit: 2017-07-11 | Discharge: 2017-07-11 | Disposition: A | Discharge status: Home / Self Care | Payer: Medicare HMO | Source: Ambulatory Visit | Attending: Gastroenterology | Admitting: Gastroenterology

## 2017-07-11 ENCOUNTER — Ambulatory Visit: Payer: Medicare HMO | Admitting: Anesthesiology

## 2017-07-11 ENCOUNTER — Encounter
Admission: RE | Disposition: A | Discharge status: Home / Self Care | Payer: Self-pay | Source: Ambulatory Visit | Attending: Gastroenterology

## 2017-07-11 DIAGNOSIS — K449 Diaphragmatic hernia without obstruction or gangrene: Secondary | ICD-10-CM | POA: Diagnosis not present

## 2017-07-11 DIAGNOSIS — R933 Abnormal findings on diagnostic imaging of other parts of digestive tract: Secondary | ICD-10-CM | POA: Insufficient documentation

## 2017-07-11 DIAGNOSIS — K210 Gastro-esophageal reflux disease with esophagitis: Secondary | ICD-10-CM | POA: Diagnosis not present

## 2017-07-11 DIAGNOSIS — K209 Esophagitis, unspecified: Secondary | ICD-10-CM | POA: Diagnosis not present

## 2017-07-11 DIAGNOSIS — K219 Gastro-esophageal reflux disease without esophagitis: Secondary | ICD-10-CM | POA: Diagnosis not present

## 2017-07-11 DIAGNOSIS — K224 Dyskinesia of esophagus: Secondary | ICD-10-CM | POA: Insufficient documentation

## 2017-07-11 DIAGNOSIS — I4891 Unspecified atrial fibrillation: Secondary | ICD-10-CM | POA: Diagnosis not present

## 2017-07-11 HISTORY — PX: ESOPHAGOGASTRODUODENOSCOPY (EGD) WITH PROPOFOL: SHX5813

## 2017-07-11 HISTORY — DX: Paroxysmal atrial fibrillation: I48.0

## 2017-07-11 HISTORY — DX: Other nonspecific abnormal finding of lung field: R91.8

## 2017-07-11 SURGERY — ESOPHAGOGASTRODUODENOSCOPY (EGD) WITH PROPOFOL
Anesthesia: General

## 2017-07-11 MED ORDER — LIDOCAINE HCL (PF) 2 % IJ SOLN
INTRAMUSCULAR | Status: AC
  Filled 2017-07-11: qty 10

## 2017-07-11 MED ORDER — LIDOCAINE HCL (CARDIAC) PF 100 MG/5ML IV SOSY
PREFILLED_SYRINGE | INTRAVENOUS | Status: DC | PRN
  Administered 2017-07-11: 50 mg via INTRAVENOUS

## 2017-07-11 MED ORDER — PROPOFOL 10 MG/ML IV BOLUS
INTRAVENOUS | Status: AC
Start: 2017-07-11 — End: ?
  Filled 2017-07-11: qty 40

## 2017-07-11 MED ORDER — PROPOFOL 10 MG/ML IV BOLUS
INTRAVENOUS | Status: DC | PRN
  Administered 2017-07-11 (×2): 50 mg via INTRAVENOUS
  Administered 2017-07-11: 100 mg via INTRAVENOUS
  Administered 2017-07-11 (×3): 50 mg via INTRAVENOUS

## 2017-07-11 MED ORDER — SODIUM CHLORIDE 0.9 % IV SOLN
INTRAVENOUS | Status: DC
  Administered 2017-07-11: 14:00:00 via INTRAVENOUS

## 2017-07-11 MED ORDER — GLYCOPYRROLATE 0.2 MG/ML IJ SOLN
INTRAMUSCULAR | Status: AC
  Filled 2017-07-11: qty 1

## 2017-07-11 MED ORDER — GLYCOPYRROLATE 0.2 MG/ML IJ SOLN
INTRAMUSCULAR | Status: DC | PRN
  Administered 2017-07-11: 0.1 mg via INTRAVENOUS

## 2017-07-11 NOTE — H&P (Signed)
Outpatient short stay form Pre-procedure 07/11/2017 2:40 PM Lollie Sails MD  Primary Physician: Fulton Reek, MD  Reason for visit: EGD  History of present illness: Patient is a 84 year old female presenting today with a complaint of gas esophageal reflux and a sensation of tightness in her chest.  She had an abnormal barium swallow indicating severe gastroesophageal reflux.  There is no evidence of a ring stenosis mass or other lesion.  She has been on several proton pump inhibitors and she states that it does not get rid of the tightness sensation.  With after further discussion with her she has recently lost her spouse.  Some of this and description sounds much like anxiety including a sensation of globus.  When she does reflux material it is burning rather than bitter.  There is no dysphagia.  There is no self regurgitation.  Patient denies any blood thinning agents or aspirin products.    Current Facility-Administered Medications:  .  0.9 %  sodium chloride infusion, , Intravenous, Continuous, Lollie Sails, MD, Last Rate: 20 mL/hr at 07/11/17 1405  Medications Prior to Admission  Medication Sig Dispense Refill Last Dose  . albuterol (PROVENTIL HFA;VENTOLIN HFA) 108 (90 BASE) MCG/ACT inhaler Inhale 2 puffs into the lungs every 6 (six) hours as needed for wheezing or shortness of breath. (Patient not taking: Reported on 07/11/2017) 1 Inhaler 2 Completed Course at Unknown time  . Alum & Mag Hydroxide-Simeth (MAGIC MOUTHWASH W/LIDOCAINE) SOLN Take 5 mLs by mouth 4 (four) times daily as needed for mouth pain. 150 mL 0   . chlorpheniramine-HYDROcodone (TUSSIONEX PENNKINETIC ER) 10-8 MG/5ML SUER Take 5 mLs by mouth 2 (two) times daily. (Patient not taking: Reported on 07/11/2017) 140 mL 0 Completed Course at Unknown time  . levofloxacin (LEVAQUIN) 750 MG tablet Take 1 tablet (750 mg total) by mouth daily. 4 tablet 0   . predniSONE (DELTASONE) 10 MG tablet Take 1 tablet (10 mg total) by  mouth daily. Take 60 mg on day one, then decrease by 10 mg daily until gone 21 tablet 0      No Known Allergies   Past Medical History:  Diagnosis Date  . GERD (gastroesophageal reflux disease)   . PAF (paroxysmal atrial fibrillation) ([place])   . Pulmonary nodules     Review of systems:      Physical Exam    Heart and lungs: Regular rate and rhythm without rub or gallop, pattern sounds much like bigeminy however.    HEENT: Normocephalic atraumatic eyes are anicteric    Other: Soft nontender nondistended    Pertinant exam for procedure: Nontender nondistended bowel sounds positive normoactive    Planned proceedures: EGD and indicated procedures.    Lollie Sails, MD Gastroenterology 07/11/2017  2:40 PM

## 2017-07-11 NOTE — Transfer of Care (Signed)
Immediate Anesthesia Transfer of Care Note  Patient: Carrie Rodriguez  Procedure(s) Performed: ESOPHAGOGASTRODUODENOSCOPY (EGD) WITH PROPOFOL (N/A )  Patient Location: PACU and Endoscopy Unit  Anesthesia Type:General  Level of Consciousness: drowsy and patient cooperative  Airway & Oxygen Therapy: Patient Spontanous Breathing  Post-op Assessment: Report given to RN, Post -op Vital signs reviewed and stable and Patient moving all extremities  Post vital signs: Reviewed and stable  Last Vitals:  Vitals Value Taken Time  BP 118/31 07/11/2017  3:03 PM  Temp 36.6 C 07/11/2017  3:03 PM  Pulse 70 07/11/2017  3:03 PM  Resp 12 07/11/2017  3:03 PM  SpO2 94 % 07/11/2017  3:03 PM    Last Pain:  Vitals:   07/11/17 1503  TempSrc: Tympanic  PainSc: Asleep         Complications: No apparent anesthesia complications

## 2017-07-11 NOTE — Op Note (Signed)
[facility] Gastroenterology Patient Name: Carrie Rodriguez Procedure Date: 07/11/2017 2:24 PM MRN: [id number] Account #: [id number] Date of Birth: 12-Nov-1933 Admit Type: Outpatient Age: 84 Room: [facility] ENDO ROOM 4 Gender: Female Note Status: Finalized Procedure:            Upper GI endoscopy Indications:          Gastro-esophageal reflux disease, Abnormal UGI series Providers:            Lollie Sails, MD Referring MD:         [name]. [name], MD (Referring MD) Medicines:            Monitored Anesthesia Care Complications:        No immediate complications. Procedure:            Pre-Anesthesia Assessment:                       - ASA Grade Assessment: III - A patient with severe                        systemic disease.                       After obtaining informed consent, the endoscope was                        passed under direct vision. Throughout the procedure,                        the patient's blood pressure, pulse, and oxygen                        saturations were monitored continuously. The Endoscope                        was introduced through the mouth, and advanced to the                        third part of duodenum. The upper GI endoscopy was                        accomplished without difficulty. The patient tolerated                        the procedure well. Findings:      LA Grade A (one or more mucosal breaks less than 5 mm, not extending       between tops of 2 mucosal folds) esophagitis with no bleeding was found.       Biopsies were taken with a cold forceps for histology.      Abnormal motility was noted in the lower third of the esophagus. The       cricopharyngeus was normal. There is spasticity of the esophageal body,       mid and lower third. The distal esophagus/lower esophageal sphincter is       open. Tertiary peristaltic waves are noted.      A small hiatal hernia was found. The Z-line was a variable distance from   incisors; the hiatal hernia was sliding.      The entire examined stomach was normal.      The examined duodenum was normal. Impression:           -  LA Grade A reflux esophagitis. Biopsied.                       - Abnormal esophageal motility.                       - Small hiatal hernia.                       - Normal stomach.                       - Normal examined duodenum. Recommendation:       - Discharge patient to home.                       - Soft diet today, then advance as tolerated to advance                        diet as tolerated.                       - Use Aciphex (rabeprazole) 20 mg PO daily daily.                       - Return to GI clinic in 4 weeks.                       - I will discuss case further with PMD. Procedure Code(s):    --- Professional ---                       [id number], Esophagogastroduodenoscopy, flexible, transoral;                        with biopsy, single or multiple Diagnosis Code(s):    --- Professional ---                       K21.0, Gastro-esophageal reflux disease with esophagitis                       K22.4, Dyskinesia of esophagus                       K44.9, Diaphragmatic hernia without obstruction or                        gangrene                       R93.3, Abnormal findings on diagnostic imaging of other                        parts of digestive tract CPT copyright 2017 American Medical Association. All rights reserved. The codes documented in this report are preliminary and upon coder review may  be revised to meet current compliance requirements. Lollie Sails, MD 07/11/2017 3:05:29 PM This report has been signed electronically. Number of Addenda: 0 Note Initiated On: 07/11/2017 2:24 PM      [facility]

## 2017-07-11 NOTE — Anesthesia Post-op Follow-up Note (Signed)
Anesthesia QCDR form completed.        

## 2017-07-11 NOTE — Anesthesia Postprocedure Evaluation (Signed)
Anesthesia Post Note  Patient: Carrie Rodriguez  Procedure(s) Performed: ESOPHAGOGASTRODUODENOSCOPY (EGD) WITH PROPOFOL (N/A )  Patient location during evaluation: Endoscopy Anesthesia Type: General Level of consciousness: awake and alert, oriented and patient cooperative Pain management: satisfactory to patient Vital Signs Assessment: vitals unstable Respiratory status: spontaneous breathing and respiratory function stable Cardiovascular status: blood pressure returned to baseline and stable Postop Assessment: no headache, no backache, patient able to bend at knees, no apparent nausea or vomiting, adequate PO intake and able to ambulate Anesthetic complications: no     Last Vitals:  Vitals:   07/11/17 1348 07/11/17 1503  BP: (!) 149/67 (!) 118/31  Pulse: 74 70  Resp: 20 12  Temp: (!) 35.9 C 36.6 C  SpO2: 98% 94%    Last Pain:  Vitals:   07/11/17 1503  TempSrc: Tympanic  PainSc: Asleep                 Lilliahna Schubring H Indalecio Malmstrom

## 2017-07-11 NOTE — Anesthesia Preprocedure Evaluation (Signed)
Anesthesia Evaluation  Patient identified by MRN, date of birth, ID band Patient awake    Reviewed: Allergy & Precautions, H&P , NPO status , Patient's Chart, lab work & pertinent test results  Airway Mallampati: III  TM Distance: <3 FB Neck ROM: limited    Dental  (+) Chipped, Poor Dentition   Pulmonary neg pulmonary ROS, neg shortness of breath,           Cardiovascular Exercise Tolerance: Good (-) Past MI + dysrhythmias Atrial Fibrillation      Neuro/Psych negative neurological ROS  negative psych ROS   GI/Hepatic Neg liver ROS, GERD  Medicated and Controlled,  Endo/Other  negative endocrine ROS  Renal/GU negative Renal ROS  negative genitourinary   Musculoskeletal   Abdominal   Peds  Hematology negative hematology ROS (+)   Anesthesia Other Findings Past Medical History: No date: GERD (gastroesophageal reflux disease) No date: PAF (paroxysmal atrial fibrillation) (HCC) No date: Pulmonary nodules  Past Surgical History: No date: ABDOMINAL HYSTERECTOMY  BMI    Body Mass Index:  20.25 kg/m      Reproductive/Obstetrics negative OB ROS                             Anesthesia Physical Anesthesia Plan  ASA: III  Anesthesia Plan: General   Post-op Pain Management:    Induction: Intravenous  PONV Risk Score and Plan: Propofol infusion and TIVA  Airway Management Planned: Natural Airway and Nasal Cannula  Additional Equipment:   Intra-op Plan:   Post-operative Plan:   Informed Consent: I have reviewed the patients History and Physical, chart, labs and discussed the procedure including the risks, benefits and alternatives for the proposed anesthesia with the patient or authorized representative who has indicated his/her understanding and acceptance.   Dental Advisory Given  Plan Discussed with: Anesthesiologist, CRNA and Surgeon  Anesthesia Plan Comments: (Patient  consented for risks of anesthesia including but not limited to:  - adverse reactions to medications - risk of intubation if required - damage to teeth, lips or other oral mucosa - sore throat or hoarseness - Damage to heart, brain, lungs or loss of life  Patient voiced understanding.)        Anesthesia Quick Evaluation

## 2017-07-14 ENCOUNTER — Encounter: Payer: Self-pay | Admitting: Gastroenterology

## 2017-07-14 DIAGNOSIS — D649 Anemia, unspecified: Secondary | ICD-10-CM | POA: Diagnosis not present

## 2017-07-14 DIAGNOSIS — R070 Pain in throat: Secondary | ICD-10-CM | POA: Diagnosis not present

## 2017-07-14 DIAGNOSIS — Z79899 Other long term (current) drug therapy: Secondary | ICD-10-CM | POA: Diagnosis not present

## 2017-07-14 DIAGNOSIS — E7800 Pure hypercholesterolemia, unspecified: Secondary | ICD-10-CM | POA: Diagnosis not present

## 2017-07-15 LAB — SURGICAL PATHOLOGY

## 2017-07-30 DIAGNOSIS — K219 Gastro-esophageal reflux disease without esophagitis: Secondary | ICD-10-CM | POA: Diagnosis not present

## 2017-07-30 DIAGNOSIS — J301 Allergic rhinitis due to pollen: Secondary | ICD-10-CM | POA: Diagnosis not present

## 2017-11-20 ENCOUNTER — Other Ambulatory Visit: Payer: Self-pay

## 2017-11-20 ENCOUNTER — Emergency Department: Payer: Medicare HMO

## 2017-11-20 ENCOUNTER — Observation Stay
Admission: EM | Admit: 2017-11-20 | Discharge: 2017-11-21 | Disposition: A | Discharge status: Home / Self Care | Payer: Medicare HMO | Attending: Specialist | Admitting: Specialist

## 2017-11-20 DIAGNOSIS — H538 Other visual disturbances: Secondary | ICD-10-CM | POA: Insufficient documentation

## 2017-11-20 DIAGNOSIS — R531 Weakness: Secondary | ICD-10-CM

## 2017-11-20 DIAGNOSIS — I480 Paroxysmal atrial fibrillation: Secondary | ICD-10-CM | POA: Insufficient documentation

## 2017-11-20 DIAGNOSIS — Z90710 Acquired absence of both cervix and uterus: Secondary | ICD-10-CM | POA: Diagnosis not present

## 2017-11-20 DIAGNOSIS — R42 Dizziness and giddiness: Secondary | ICD-10-CM | POA: Diagnosis not present

## 2017-11-20 DIAGNOSIS — Z7982 Long term (current) use of aspirin: Secondary | ICD-10-CM | POA: Diagnosis not present

## 2017-11-20 DIAGNOSIS — R270 Ataxia, unspecified: Secondary | ICD-10-CM

## 2017-11-20 DIAGNOSIS — Z7951 Long term (current) use of inhaled steroids: Secondary | ICD-10-CM | POA: Diagnosis not present

## 2017-11-20 DIAGNOSIS — R918 Other nonspecific abnormal finding of lung field: Secondary | ICD-10-CM | POA: Diagnosis not present

## 2017-11-20 DIAGNOSIS — K219 Gastro-esophageal reflux disease without esophagitis: Secondary | ICD-10-CM | POA: Diagnosis not present

## 2017-11-20 DIAGNOSIS — Z79899 Other long term (current) drug therapy: Secondary | ICD-10-CM | POA: Diagnosis not present

## 2017-11-20 LAB — BASIC METABOLIC PANEL
Anion gap: 10 (ref 5–15)
BUN: 23 mg/dL (ref 8–23)
CO2: 29 mmol/L (ref 22–32)
CREATININE: 0.99 mg/dL (ref 0.44–1.00)
Calcium: 10 mg/dL (ref 8.9–10.3)
Chloride: 106 mmol/L (ref 98–111)
GFR calc Af Amer: 60 mL/min — ABNORMAL LOW (ref >60)
GFR, EST NON AFRICAN AMERICAN: 52 mL/min — AB (ref >60)
GLUCOSE: 93 mg/dL (ref 70–99)
Potassium: 4.4 mmol/L (ref 3.5–5.1)
Sodium: 145 mmol/L (ref 135–145)

## 2017-11-20 LAB — TROPONIN I

## 2017-11-20 LAB — CBC
HCT: 37.2 % (ref 36.0–46.0)
Hemoglobin: 12 g/dL (ref 12.0–15.0)
MCH: 28.8 pg (ref 26.0–34.0)
MCHC: 32.3 g/dL (ref 30.0–36.0)
MCV: 89.4 fL (ref 80.0–100.0)
Platelets: 211 10*3/uL (ref 150–400)
RBC: 4.16 MIL/uL (ref 3.87–5.11)
RDW: 14.1 % (ref 11.5–15.5)
WBC: 7 10*3/uL (ref 4.0–10.5)
nRBC: 0 % (ref 0.0–0.2)

## 2017-11-20 MED ORDER — ENOXAPARIN SODIUM 40 MG/0.4ML ~~LOC~~ SOLN: 40.0000 mg | SUBCUTANEOUS | Status: DC

## 2017-11-20 MED ORDER — PANTOPRAZOLE SODIUM 40 MG PO TBEC
40.0000 mg | DELAYED_RELEASE_TABLET | Freq: Every day | ORAL | Status: DC
  Administered 2017-11-21: 11:00:00 40 mg via ORAL
  Filled 2017-11-20: qty 1

## 2017-11-20 MED ORDER — ATORVASTATIN CALCIUM 20 MG PO TABS: 40.0000 mg | ORAL_TABLET | Freq: Every day | ORAL | Status: DC

## 2017-11-20 MED ORDER — TETRACAINE HCL 0.5 % OP SOLN
OPHTHALMIC | Status: AC
  Administered 2017-11-20: 1 [drp] via OPHTHALMIC
  Filled 2017-11-20: qty 4

## 2017-11-20 MED ORDER — SENNOSIDES-DOCUSATE SODIUM 8.6-50 MG PO TABS: 1.0000 | ORAL_TABLET | Freq: Every evening | ORAL | Status: DC | PRN

## 2017-11-20 MED ORDER — TETRACAINE HCL 0.5 % OP SOLN
1.0000 [drp] | Freq: Once | OPHTHALMIC | Status: AC
  Administered 2017-11-20: 1 [drp] via OPHTHALMIC

## 2017-11-20 MED ORDER — STROKE: EARLY STAGES OF RECOVERY BOOK
Freq: Once | Status: AC
  Administered 2017-11-20: 23:00:00

## 2017-11-20 MED ORDER — ACETAMINOPHEN 650 MG RE SUPP: 650.0000 mg | RECTAL | Status: DC | PRN

## 2017-11-20 MED ORDER — ACETAMINOPHEN 160 MG/5ML PO SOLN
650.0000 mg | ORAL | Status: DC | PRN
  Filled 2017-11-20: qty 20.3

## 2017-11-20 MED ORDER — SODIUM CHLORIDE 0.9 % IV SOLN
INTRAVENOUS | Status: DC
  Administered 2017-11-20: 23:00:00 via INTRAVENOUS

## 2017-11-20 MED ORDER — ASPIRIN 300 MG RE SUPP
300.0000 mg | Freq: Every day | RECTAL | Status: DC
  Filled 2017-11-20: qty 1

## 2017-11-20 MED ORDER — ACETAMINOPHEN 325 MG PO TABS: 650.0000 mg | ORAL_TABLET | ORAL | Status: DC | PRN

## 2017-11-20 MED ORDER — ASPIRIN EC 325 MG PO TBEC
325.0000 mg | DELAYED_RELEASE_TABLET | Freq: Every day | ORAL | Status: DC
  Administered 2017-11-21: 325 mg via ORAL
  Filled 2017-11-20: qty 1

## 2017-11-20 MED ORDER — MAGIC MOUTHWASH W/LIDOCAINE
5.0000 mL | Freq: Four times a day (QID) | ORAL | Status: DC | PRN
  Filled 2017-11-20: qty 5

## 2017-11-20 NOTE — Progress Notes (Signed)
Advanced Care Plan.  Purpose of Encounter: CODE STATUS. Parties in Attendance: The patient and me. Medical Story: Carrie Rodriguez  is a 84 y.o. female with a known history of PAF, pulmonary nodules and GERD. The patient has dizziness, double vision double vision since last night. She is placed for observation to rule out CVA.  I discussed with patient about her current condition, prognosis and CODE STATUS.  The patient does not want to be resuscitated or intubated. Plan:  Code Status: DNR.  Time spent discussing advance care planning: 16-17 minutes.

## 2017-11-20 NOTE — ED Triage Notes (Addendum)
Pt to the er for double vision and dizziness. Pt has had difficulty walking. Denies pain. Blurred vision is in the right eye.

## 2017-11-20 NOTE — H&P (Signed)
[facility] [place] NAME: Carrie Rodriguez    MR#:  [id number]  DATE OF BIRTH:  1933/10/19  DATE OF ADMISSION:  11/20/2017  PRIMARY CARE PHYSICIAN: Idelle Crouch, MD   REQUESTING/REFERRING PHYSICIAN: Harvest Dark, MD  CHIEF COMPLAINT:   Chief Complaint  Patient presents with  . Dizziness   Dizziness, double vision double vision since last night HISTORY OF PRESENT ILLNESS:  Carrie Rodriguez  is a 84 y.o. female with a known history of PAF, pulmonary nodules and GERD.  The patient has good daily activity, walking 5 days a week.  She started to have dizziness, double vision and blurry vision since last night.  She also has imbalance.  But she denies any headache, focal weakness or numbness or tingling.  He denies any incontinence, slurred speech or dysphagia.  CT of the head is unremarkable.  ED physician request admission to rule out CVA.  PAST MEDICAL HISTORY:   Past Medical History:  Diagnosis Date  . GERD (gastroesophageal reflux disease)   . PAF (paroxysmal atrial fibrillation) ([place])   . Pulmonary nodules     PAST SURGICAL HISTORY:   Past Surgical History:  Procedure Laterality Date  . ABDOMINAL HYSTERECTOMY    . ESOPHAGOGASTRODUODENOSCOPY (EGD) WITH PROPOFOL N/A 07/11/2017   Procedure: ESOPHAGOGASTRODUODENOSCOPY (EGD) WITH PROPOFOL;  Surgeon: Lollie Sails, MD;  Location: [facility] ENDOSCOPY;  Service: Endoscopy;  Laterality: N/A;    SOCIAL HISTORY:   Social History   Tobacco Use  . Smoking status: Never Smoker  . Smokeless tobacco: Never Used  Substance Use Topics  . Alcohol use: No    FAMILY HISTORY:   Family History  Problem Relation Age of Onset  . Breast cancer Neg Hx     DRUG ALLERGIES:  No Known Allergies  REVIEW OF SYSTEMS:   Review of Systems  Constitutional: Negative for chills, fever and malaise/fatigue.  HENT: Negative for sore throat.   Eyes: Positive for blurred vision and double  vision.  Respiratory: Negative for cough, hemoptysis, shortness of breath, wheezing and stridor.   Cardiovascular: Negative for chest pain, palpitations, orthopnea and leg swelling.  Gastrointestinal: Negative for abdominal pain, blood in stool, diarrhea, melena, nausea and vomiting.  Genitourinary: Negative for dysuria, flank pain and hematuria.  Musculoskeletal: Negative for back pain and joint pain.  Neurological: Positive for dizziness. Negative for sensory change, focal weakness, seizures, loss of consciousness, weakness and headaches.  Endo/Heme/Allergies: Negative for polydipsia.  Psychiatric/Behavioral: Negative for depression. The patient is not nervous/anxious.     MEDICATIONS AT HOME:   Prior to Admission medications   Medication Sig Start Date End Date Taking? Authorizing Provider  albuterol (PROVENTIL HFA;VENTOLIN HFA) 108 (90 BASE) MCG/ACT inhaler Inhale 2 puffs into the lungs every 6 (six) hours as needed for wheezing or shortness of breath. Patient not taking: Reported on 07/11/2017 10/04/14   Earleen Newport, MD  Alum & Mag Hydroxide-Simeth (MAGIC MOUTHWASH W/LIDOCAINE) SOLN Take 5 mLs by mouth 4 (four) times daily as needed for mouth pain. 09/30/14   Menshew, Dannielle Karvonen, PA-C  chlorpheniramine-HYDROcodone (TUSSIONEX PENNKINETIC ER) 10-8 MG/5ML SUER Take 5 mLs by mouth 2 (two) times daily. Patient not taking: Reported on 07/11/2017 10/04/14   Earleen Newport, MD  levofloxacin (LEVAQUIN) 750 MG tablet Take 1 tablet (750 mg total) by mouth daily. 10/04/14   Earleen Newport, MD  predniSONE (DELTASONE) 10 MG tablet Take 1 tablet (10 mg total) by mouth daily. Take 60  mg on day one, then decrease by 10 mg daily until gone 10/04/14   Earleen Newport, MD      VITAL SIGNS:  Blood pressure (!) 157/66, pulse 80, temperature 97.7 F (36.5 C), temperature source Oral, resp. rate 18, height 5\' 4"  (1.626 m), weight 53.1 kg, SpO2 100 %.  PHYSICAL EXAMINATION:  Physical  Exam  GENERAL:  84 y.o.-year-old patient lying in the bed with no acute distress.  EYES: Pupils equal, round, reactive to light and accommodation. No scleral icterus. Extraocular muscles intact.  HEENT: Head atraumatic, normocephalic. Oropharynx and nasopharynx clear.  NECK:  Supple, no jugular venous distention. No thyroid enlargement, no tenderness.  LUNGS: Normal breath sounds bilaterally, no wheezing, rales,rhonchi or crepitation. No use of accessory muscles of respiration.  CARDIOVASCULAR: S1, S2 normal. No murmurs, rubs, or gallops.  ABDOMEN: Soft, nontender, nondistended. Bowel sounds present. No organomegaly or mass.  EXTREMITIES: No pedal edema, cyanosis, or clubbing.  NEUROLOGIC: Cranial nerves II through XII are intact. Muscle strength 5/5 in all extremities. Sensation intact. Gait not checked.  PSYCHIATRIC: The patient is alert and oriented x 3.  SKIN: No obvious rash, lesion, or ulcer.   LABORATORY PANEL:   CBC Recent Labs  Lab 11/20/17 1515  WBC 7.0  HGB 12.0  HCT 37.2  PLT 211   ------------------------------------------------------------------------------------------------------------------  Chemistries  Recent Labs  Lab 11/20/17 1515  NA 145  K 4.4  CL 106  CO2 29  GLUCOSE 93  BUN 23  CREATININE 0.99  CALCIUM 10.0   ------------------------------------------------------------------------------------------------------------------  Cardiac Enzymes Recent Labs  Lab 11/20/17 1515  TROPONINI <0.03   ------------------------------------------------------------------------------------------------------------------  RADIOLOGY:  Ct Head Wo Contrast  Result Date: 11/20/2017 CLINICAL DATA:  Double vision and dizziness.  Difficulty walking. EXAM: CT HEAD WITHOUT CONTRAST TECHNIQUE: Contiguous axial images were obtained from the base of the skull through the vertex without intravenous contrast. COMPARISON:  None. FINDINGS: BRAIN: Likely age related mild  involutional changes of the brain. No intraparenchymal hemorrhage, mass effect nor midline shift. Periventricular and subcortical white matter hypodensities consistent with chronic small vessel ischemic disease are identified. No acute large vascular territory infarcts. No abnormal extra-axial fluid collections. Basal cisterns are not effaced and midline. VASCULAR: Moderate calcific atherosclerosis of the carotid siphons. SKULL: No skull fracture. No significant scalp soft tissue swelling. SINUSES/ORBITS: The mastoid air-cells are clear. The included paranasal sinuses are well-aerated.The included ocular globes and orbital contents are non-suspicious. OTHER: None. IMPRESSION: Age related involutional changes of the brain without acute intracranial abnormality. Electronically Signed   By: Ashley Royalty M.D.   On: 11/20/2017 20:05      IMPRESSION AND PLAN:   Dizziness and blurred vision. The patient will be placed for observation to rule out CVA. Start aspirin and Lipitor, neurochecks, MRI of the brain, echocardiograph and carotid duplex.  PAF.  EKG shows sinus rhythm.  Telemetry monitor. GERD.  PPI.  All the records are reviewed and case discussed with ED provider. Management plans discussed with the patient, family and they are in agreement.  CODE STATUS: DNR.  TOTAL TIME TAKING CARE OF THIS PATIENT: 33 minutes.    Demetrios Loll M.D on 11/20/2017 at 9:38 PM  Between 7am to 6pm - Pager - [phone]  After 6pm go to [email or web address] - Proofreader  [facility]  Office  [phone]  CC: Primary care physician; Idelle Crouch, MD   Note: This dictation was prepared with Dragon dictation along with smaller phrase technology. Any transcriptional errors  that result from this process are unin

## 2017-11-20 NOTE — ED Triage Notes (Signed)
Says she has double vision today.  Was okay yesterday.  Went to bed at 10pm.  When she woke this am she felt funny and then noticed the double vision.came in ambulatory without assist.

## 2017-11-20 NOTE — ED Provider Notes (Signed)
[facility] Emergency Department Provider Note  Time seen: 8:15 PM  I have reviewed the triage vital signs and the nursing notes.   HISTORY  Chief Complaint Dizziness    HPI Carrie Rodriguez is a 84 y.o. female with a past medical history of gastric reflux, proximal atrial fibrillation, presents to the emergency department for dizziness, off balance and visual disturbance.  According to the patient since 10 PM last night she has been seeing double vision states mostly out of her right eye.  States if she looks out of her left eye looks fairly normal, but when she looks out of both eyes it looks double when she looks out of her right eye still appears somewhat double and blurry.  States she has been very off balance.  Son who is here with the patient states she was having to hold onto walls to walk.  Denies any headache.  Denies any weakness or numbness of any arm or leg.  States she has felt very weak today.  Denies any chest pain or trouble breathing.  States her symptoms have continued so she came to the emergency department for evaluation.  Past Medical History:  Diagnosis Date  . GERD (gastroesophageal reflux disease)   . PAF (paroxysmal atrial fibrillation) ([place])   . Pulmonary nodules     There are no active problems to display for this patient.   Past Surgical History:  Procedure Laterality Date  . ABDOMINAL HYSTERECTOMY    . ESOPHAGOGASTRODUODENOSCOPY (EGD) WITH PROPOFOL N/A 07/11/2017   Procedure: ESOPHAGOGASTRODUODENOSCOPY (EGD) WITH PROPOFOL;  Surgeon: Lollie Sails, MD;  Location: [facility] ENDOSCOPY;  Service: Endoscopy;  Laterality: N/A;    Prior to Admission medications   Medication Sig Start Date End Date Taking? Authorizing Provider  albuterol (PROVENTIL HFA;VENTOLIN HFA) 108 (90 BASE) MCG/ACT inhaler Inhale 2 puffs into the lungs every 6 (six) hours as needed for wheezing or shortness of breath. Patient not taking: Reported on 07/11/2017  10/04/14   Earleen Newport, MD  Alum & Mag Hydroxide-Simeth (MAGIC MOUTHWASH W/LIDOCAINE) SOLN Take 5 mLs by mouth 4 (four) times daily as needed for mouth pain. 09/30/14   Menshew, Dannielle Karvonen, PA-C  chlorpheniramine-HYDROcodone (TUSSIONEX PENNKINETIC ER) 10-8 MG/5ML SUER Take 5 mLs by mouth 2 (two) times daily. Patient not taking: Reported on 07/11/2017 10/04/14   Earleen Newport, MD  levofloxacin (LEVAQUIN) 750 MG tablet Take 1 tablet (750 mg total) by mouth daily. 10/04/14   Earleen Newport, MD  predniSONE (DELTASONE) 10 MG tablet Take 1 tablet (10 mg total) by mouth daily. Take 60 mg on day one, then decrease by 10 mg daily until gone 10/04/14   Earleen Newport, MD    No Known Allergies  Family History  Problem Relation Age of Onset  . Breast cancer Neg Hx     Social History Social History   Tobacco Use  . Smoking status: Never Smoker  . Smokeless tobacco: Never Used  Substance Use Topics  . Alcohol use: No  . Drug use: Not Currently    Review of Systems Constitutional: Negative for fever. Eyes: Positive for double vision and blurred vision. ENT: Negative for recent illness/congestion Cardiovascular: Negative for chest pain. Respiratory: Negative for shortness of breath. Gastrointestinal: Negative for abdominal pain, vomiting Genitourinary: Negative for urinary compaints Musculoskeletal: Negative for musculoskeletal complaints Skin: Negative for skin complaints  Neurological: Negative for headache.  Feeling off balance today. All other ROS negative  ____________________________________________   PHYSICAL EXAM:  VITAL SIGNS: ED Triage Vitals [11/20/17 1509]  Enc Vitals Group     BP (!) 157/66     Pulse Rate 80     Resp 18     Temp 97.7 F (36.5 C)     Temp Source Oral     SpO2 100 %     Weight 117 lb (53.1 kg)     Height 5\' 4"  (1.626 m)     Head Circumference      Peak Flow      Pain Score 0     Pain Loc      Pain Edu?      Excl. in  [place]?    Constitutional: Alert and oriented. Well appearing and in no distress. Eyes: Patient has 2 mm pupils, equal and reactive bilaterally, extraocular muscles intact, visual field testing intact bilaterally without visual field cuts.  Patient has subjective double vision and blurred vision out of the right eye. ENT   Head: Normocephalic and atraumatic.   Mouth/Throat: Mucous membranes are moist. Cardiovascular: Normal rate, regular rhythm. No murmur Respiratory: Normal respiratory effort without tachypnea nor retractions. Breath sounds are clear  Gastrointestinal: Soft and nontender. No distention. Musculoskeletal: Nontender with normal range of motion in all extremities. Neurologic:  Normal speech and language. No gross focal neurologic deficits.  Equal grip strength bilaterally.  No pronator drift.  5/5 motor in both arms and legs.  No cranial nerve deficits. Skin:  Skin is warm, dry and intact.  Psychiatric: Mood and affect are normal. Speech and behavior are normal.   ____________________________________________    EKG  EKG reviewed and interpreted by myself shows a sinus rhythm 83 bpm with a narrow QRS, normal axis, normal intervals, no concerning ST changes.  ____________________________________________    RADIOLOGY  CT scan of the head shows age-related changes without acute abnormality.  ____________________________________________   INITIAL IMPRESSION / ASSESSMENT AND PLAN / ED COURSE  Pertinent labs & imaging results that were available during my care of the patient were reviewed by me and considered in my medical decision making (see chart for details).  Patient presents to the emergency department for visual disturbance dizziness and ataxia starting at 10 PM last night.  Son states it was very significant today, having to hold onto walls to ambulate which is extremely atypical.  Patient is examination is overall reassuring, no obvious neurological deficits on  examination and overall normal physical exam.  I used a Tono-Pen to check tonometry pressures of the right eye which were 16, 16, 17.  Given the patient's significant ataxia reported today along with acute onset of visual changes including diplopia we will admit to the hospital service for further work-up.  Patient and family are agreeable to this plan of care.   NIH Stroke Scale   Interval: Baseline Time: 8:20 PM Person Administering Scale: Harvest Dark  Administer stroke scale items in the order listed. Record performance in each category after each subscale exam. Do not go back and change scores. Follow directions provided for each exam technique. Scores should reflect what the patient does, not what the clinician thinks the patient can do. The clinician should record answers while administering the exam and work quickly. Except where indicated, the patient should not be coached (i.e., repeated requests to patient to make a special effort).   1a  Level of consciousness: 0=alert; keenly responsive  1b. LOC questions:  0=Performs both tasks correctly  1c. LOC commands: 0=Performs both tasks correctly  2.  Best  Gaze: 0=normal  3.  Visual: 1=Partial hemianopia  4. Facial Palsy: 0=Normal symmetric movement  5a.  Motor left arm: 0=No drift, limb holds 90 (or 45) degrees for full 10 seconds  5b.  Motor right arm: 0=No drift, limb holds 90 (or 45) degrees for full 10 seconds  6a. motor left leg: 0=No drift, limb holds 90 (or 45) degrees for full 10 seconds  6b  Motor right leg:  0=No drift, limb holds 90 (or 45) degrees for full 10 seconds  7. Limb Ataxia: 0=Absent  8.  Sensory: 0=Normal; no sensory loss  9. Best Language:  0=No aphasia, normal  10. Dysarthria: 0=Normal  11. Extinction and Inattention: 0=No abnormality  12. Distal motor function: 0=Normal   Total:   1    ____________________________________________   FINAL CLINICAL IMPRESSION(S) / ED DIAGNOSES  Ataxia Visual  disturbance    Harvest Dark, MD 11/20/17 2021

## 2017-11-20 NOTE — ED Notes (Signed)
Patient denies pain and is resting comfortably.  

## 2017-11-21 ENCOUNTER — Observation Stay: Payer: Medicare HMO

## 2017-11-21 ENCOUNTER — Observation Stay (HOSPITAL_BASED_OUTPATIENT_CLINIC_OR_DEPARTMENT_OTHER)
Admit: 2017-11-21 | Discharge: 2017-11-21 | Disposition: A | Discharge status: Home / Self Care | Payer: Medicare HMO | Attending: Internal Medicine | Admitting: Internal Medicine

## 2017-11-21 DIAGNOSIS — I4821 Permanent atrial fibrillation: Secondary | ICD-10-CM | POA: Diagnosis not present

## 2017-11-21 DIAGNOSIS — H538 Other visual disturbances: Secondary | ICD-10-CM | POA: Diagnosis not present

## 2017-11-21 DIAGNOSIS — R42 Dizziness and giddiness: Secondary | ICD-10-CM | POA: Diagnosis not present

## 2017-11-21 DIAGNOSIS — I480 Paroxysmal atrial fibrillation: Secondary | ICD-10-CM | POA: Diagnosis not present

## 2017-11-21 DIAGNOSIS — K219 Gastro-esophageal reflux disease without esophagitis: Secondary | ICD-10-CM | POA: Diagnosis not present

## 2017-11-21 DIAGNOSIS — I6523 Occlusion and stenosis of bilateral carotid arteries: Secondary | ICD-10-CM | POA: Diagnosis not present

## 2017-11-21 LAB — URINALYSIS, COMPLETE (UACMP) WITH MICROSCOPIC
BILIRUBIN URINE: NEGATIVE
GLUCOSE, UA: NEGATIVE mg/dL
KETONES UR: 5 mg/dL — AB
LEUKOCYTES UA: NEGATIVE
NITRITE: NEGATIVE
PH: 6 (ref 5.0–8.0)
Protein, ur: NEGATIVE mg/dL
SPECIFIC GRAVITY, URINE: 1.014 (ref 1.005–1.030)

## 2017-11-21 LAB — LIPID PANEL
CHOL/HDL RATIO: 3.3 ratio
Cholesterol: 222 mg/dL — ABNORMAL HIGH (ref 0–200)
HDL: 67 mg/dL (ref >40)
LDL CALC: 145 mg/dL — AB (ref 0–99)
Triglycerides: 48 mg/dL (ref <150)
VLDL: 10 mg/dL (ref 0–40)

## 2017-11-21 LAB — HEMOGLOBIN A1C
Hgb A1c MFr Bld: 5.5 % (ref 4.8–5.6)
Mean Plasma Glucose: 111.15 mg/dL

## 2017-11-21 LAB — ECHOCARDIOGRAM COMPLETE
Height: 64 in
WEIGHTICAEL: 1918.88 [oz_av]

## 2017-11-21 MED ORDER — MECLIZINE HCL 25 MG PO TABS
25.0000 mg | ORAL_TABLET | Freq: Three times a day (TID) | ORAL | Status: DC | PRN
  Filled 2017-11-21: qty 1

## 2017-11-21 MED ORDER — MECLIZINE HCL 25 MG PO TABS: 25.0000 mg | ORAL_TABLET | Freq: Three times a day (TID) | ORAL | 0 refills | Status: DC | PRN

## 2017-11-21 NOTE — Progress Notes (Signed)
Pt alert and oriented x 4. Admitted during the night to rule out stroke. NIH= 1 due to visual changes. Denies pain. CT head negative. Just returned from MRI. Up to [facility] with standby assist. Neuro consult pending. Stroke educational materials given.

## 2017-11-21 NOTE — Progress Notes (Signed)
Patient discharged home per MD order. All discharge instructions given and all questions answered. 

## 2017-11-21 NOTE — Care Management Note (Signed)
Case Management Note  Patient Details  Name: LOTA LEAMER MRN: [id number] Date of Birth: 17-Apr-1933  Subjective/Objective: Admitted to [facility] under observation status with the diagnosis of dizziness. Lives alone. Daughter is Helaine Chess [phone]). Sees Dr. [name] every 6 months. Prescriptions are filled at [facility] on [address]. No home health. No skilled facility. No home oxygen. No medical equipment in the home. Takes care of all basic activities of daily living herself, drives, No falls. Good appetite. Discharge to home today per Dr. Verdell Carmine. Daughter will transport                 Action/Plan: No follow-up needs identified.   Expected Discharge Date:  11/21/17               Expected Discharge Plan:     In-House Referral:     Discharge planning Services     Post Acute Care Choice:    Choice offered to:     DME Arranged:    DME Agency:     HH Arranged:    HH Agency:     Status of Service:     If discussed at [employer], dates discussed:    Additional Comments:  Shelbie Ammons, RN MSN CCM Care Management [phone] 11/21/2017, 12:12 PM

## 2017-11-21 NOTE — Plan of Care (Signed)
  Problem: Education: Goal: Knowledge of disease or condition will improve Outcome: Progressing Goal: Knowledge of secondary prevention will improve Outcome: Progressing Goal: Knowledge of patient specific risk factors addressed and post discharge goals established will improve Outcome: Progressing   Problem: Health Behavior/Discharge Planning: Goal: Ability to manage health-related needs will improve Outcome: Progressing   Problem: Ischemic Stroke/TIA Tissue Perfusion: Goal: Complications of ischemic stroke/TIA will be minimized Outcome: Progressing   Problem: Coping: Goal: Level of anxiety will decrease Outcome: Progressing   Problem: Pain Managment: Goal: General experience of comfort will improve Outcome: Progressing   Problem: Safety: Goal: Ability to remain free from injury will improve Outcome: Progressing

## 2017-11-21 NOTE — Progress Notes (Signed)
*  PRELIMINARY RESULTS* Echocardiogram 2D Echocardiogram has been performed.  Sherrie Sport 11/21/2017, 10:04 AM

## 2017-11-21 NOTE — Evaluation (Signed)
Physical Therapy Evaluation Patient Details Name: Carrie Rodriguez MRN: [id number] DOB: 1933-10-23 Today's Date: 11/21/2017   History of Present Illness  84 y/o female here with dizziness, weakness.  Her under observation status to rule out CVA, imaging was negative.  Clinical Impression  Pt did well with all aspects of PT exam.  She was able to ambulate ~300 ft and negotiate up/down flight of steps.  No balance or safety issues and generally showed good confidence reporting only minimally weaker than normal "probably because I was hardly able to sleep."  Pt safe to return home, no further PT needs.     Follow Up Recommendations No PT follow up    Equipment Recommendations       Recommendations for Other Services       Precautions / Restrictions Precautions Precautions: None Restrictions Weight Bearing Restrictions: No      Mobility  Bed Mobility Overal bed mobility: Independent                Transfers Overall transfer level: Independent                  Ambulation/Gait Ambulation/Gait assistance: Independent Gait Distance (Feet): 300 Feet Assistive device: None       General Gait Details: Pt with good confidence, safety and generally reports gait being near her baseline.   Stairs Stairs: Yes Stairs assistance: Independent Stair Management: One rail Left Number of Stairs: 16 General stair comments: no issues, safe and independent  Wheelchair Mobility    Modified Rankin (Stroke Patients Only)       Balance Overall balance assessment: Independent                                           Pertinent Vitals/Pain Pain Assessment: No/denies pain    Home Living Family/patient expects to be discharged to:: Private residence Living Arrangements: Alone Available Help at Discharge: Family;Available PRN/intermittently   Home Access: Stairs to enter Entrance Stairs-Rails: Left Entrance Stairs-Number of Steps: 16 Home Layout:  (garage down stairs)        Prior Function Level of Independence: Independent         Comments: Pt works out multiple times a week, 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, Health visitor Dominance   Dominant Hand: Right    Extremity/Trunk Assessment   Upper Extremity Assessment Upper Extremity Assessment: Overall WFL for tasks assessed    Lower Extremity Assessment Lower Extremity Assessment: Overall WFL for tasks assessed       Communication   Communication: No difficulties  Cognition Arousal/Alertness: Awake/alert Behavior During Therapy: WFL for tasks assessed/performed Overall Cognitive Status: Within Functional Limits for tasks assessed                                        General Comments      Exercises     Assessment/Plan    PT Assessment Patent does not need any further PT services  PT Problem List         PT Treatment Interventions      PT Goals (Current goals can be found in the Care Plan section)  Acute Rehab PT Goals Patient Stated Goal: go home PT Goal Formulation: All assessment and education complete, DC therapy    Frequency  Barriers to discharge        Co-evaluation               AM-PAC PT "6 Clicks" Daily Activity  Outcome Measure Difficulty turning over in bed (including adjusting bedclothes, sheets and blankets)?: None Difficulty moving from lying on back to sitting on the side of the bed? : None Difficulty sitting down on and standing up from a chair with arms (e.g., wheelchair, bedside commode, etc,.)?: None Help needed moving to and from a bed to chair (including a wheelchair)?: None Help needed walking in hospital room?: None Help needed climbing 3-5 steps with a railing? : None 6 Click Score: 24    End of Session Equipment Utilized During Treatment: Gait belt Activity Tolerance: Patient tolerated treatment well Patient left: in bed;with call bell/phone within reach Nurse Communication: Mobility status PT  Visit Diagnosis: Unsteadiness on feet (R26.81);Muscle weakness (generalized) (M62.81)    Time: 3276-1470 PT Time Calculation (min) (ACUTE ONLY): 13 min   Charges:   PT Evaluation $PT Eval Low Complexity: 1 Low          Kreg Shropshire, DPT 11/21/2017, 1:19 PM

## 2017-11-21 NOTE — Discharge Summary (Signed)
[facility] [place] NAME: Carrie Rodriguez    MR#:  [id number]  DATE OF BIRTH:  Jun 19, 1933  DATE OF ADMISSION:  11/20/2017 ADMITTING PHYSICIAN: Demetrios Loll, MD  DATE OF DISCHARGE: 11/21/2017  1:44 PM  PRIMARY CARE PHYSICIAN: Idelle Crouch, MD    ADMISSION DIAGNOSIS:  Dizziness [R42] Ataxia [R27.0] Weakness [R53.1]  DISCHARGE DIAGNOSIS:  Active Problems:   Dizziness   SECONDARY DIAGNOSIS:   Past Medical History:  Diagnosis Date  . GERD (gastroesophageal reflux disease)   . PAF (paroxysmal atrial fibrillation) ([place])   . Pulmonary nodules     HOSPITAL COURSE:   84 year old female with past medical history of paroxysmal atrial fibrillation, GERD who presents to the hospital due to dizziness.  1.  Dizziness-etiology unclear but suspected to be secondary to vertigo. - Patient was admitted in the hospital overnight underwent an extensive neurologic work-up including CT head, MRI MRA of the brain.  Patient had no acute stroke or any intracranial stenosis. - Patient also had a carotid duplex which was negative for hemodynamically significant carotid artery stenosis, echocardiogram showed no source of emboli and normal ejection fraction. - She is not orthostatic, she is therefore being discharged empirically on meclizine and outpatient ENT evaluation.  2.  Blurry vision- unlikely related to an acute stroke.  Patient was told to follow-up with outpatient ophthalmology.  3.  GERD-patient will continue her AcipHex  DISCHARGE CONDITIONS:   Stable  CONSULTS OBTAINED:    DRUG ALLERGIES:  No Known Allergies  DISCHARGE MEDICATIONS:   Allergies as of 11/21/2017   No Known Allergies     Medication List    STOP taking these medications   albuterol 108 (90 Base) MCG/ACT inhaler Commonly known as:  PROVENTIL HFA;VENTOLIN HFA   chlorpheniramine-HYDROcodone 10-8 MG/5ML Suer Commonly known as:  TUSSIONEX   levofloxacin 750 MG  tablet Commonly known as:  LEVAQUIN   magic mouthwash w/lidocaine Soln   predniSONE 10 MG tablet Commonly known as:  DELTASONE     TAKE these medications   aspirin EC 81 MG tablet Take 1 tablet by mouth daily.   calcium carbonate 500 MG chewable tablet Commonly known as:  TUMS - dosed in mg elemental calcium Chew 1 tablet by mouth daily as needed for indigestion or heartburn.   meclizine 25 MG tablet Commonly known as:  ANTIVERT Take 1 tablet (25 mg total) by mouth 3 (three) times daily as needed for dizziness.   RABEprazole 20 MG tablet Commonly known as:  ACIPHEX Take 20 mg by mouth daily.         DISCHARGE INSTRUCTIONS:   DIET:  Regular diet  DISCHARGE CONDITION:  Stable  ACTIVITY:  Activity as tolerated  OXYGEN:  Home Oxygen: No.   Oxygen Delivery: room air  DISCHARGE LOCATION:  home   If you experience worsening of your admission symptoms, develop shortness of breath, life threatening emergency, suicidal or homicidal thoughts you must seek medical attention immediately by calling 911 or calling your MD immediately  if symptoms less severe.  You Must read complete instructions/literature along with all the possible adverse reactions/side effects for all the Medicines you take and that have been prescribed to you. Take any new Medicines after you have completely understood and accpet all the possible adverse reactions/side effects.   Please note  You were cared for by a hospitalist during your hospital stay. If you have any questions about your discharge medications or the care you received while  you were in the hospital after you are discharged, you can call the unit and asked to speak with the hospitalist on call if the hospitalist that took care of you is not available. Once you are discharged, your primary care physician will handle any further medical issues. Please note that NO REFILLS for any discharge medications will be authorized once you are  discharged, as it is imperative that you return to your primary care physician (or establish a relationship with a primary care physician if you do not have one) for your aftercare needs so that they can reassess your need for medications and monitor your lab values.     Today   Still complaining of some mild dizziness but improved since yesterday.  CT head, MRI of the brain negative for acute stroke.  Will discharge home today with outpatient ENT evaluation.  Patient and family in agreement.  VITAL SIGNS:  Blood pressure 134/60, pulse 75, temperature 98.4 F (36.9 C), temperature source Oral, resp. rate 18, height 5\' 4"  (1.626 m), weight 54.4 kg, SpO2 98 %.  I/O:    Intake/Output Summary (Last 24 hours) at 11/21/2017 1501 Last data filed at 11/21/2017 0957 Gross per 24 hour  Intake 432.12 ml  Output -  Net 432.12 ml    PHYSICAL EXAMINATION:  GENERAL:  84 y.o.-year-old patient lying in the bed with no acute distress.  EYES: Pupils equal, round, reactive to light and accommodation. No scleral icterus. Extraocular muscles intact.  HEENT: Head atraumatic, normocephalic. Oropharynx and nasopharynx clear.  NECK:  Supple, no jugular venous distention. No thyroid enlargement, no tenderness.  LUNGS: Normal breath sounds bilaterally, no wheezing, rales,rhonchi. No use of accessory muscles of respiration.  CARDIOVASCULAR: S1, S2 normal. No murmurs, rubs, or gallops.  ABDOMEN: Soft, non-tender, non-distended. Bowel sounds present. No organomegaly or mass.  EXTREMITIES: No pedal edema, cyanosis, or clubbing.  NEUROLOGIC: Cranial nerves II through XII are intact. No focal motor or sensory defecits b/l.  PSYCHIATRIC: The patient is alert and oriented x 3.  SKIN: No obvious rash, lesion, or ulcer.   DATA REVIEW:   CBC Recent Labs  Lab 11/20/17 1515  WBC 7.0  HGB 12.0  HCT 37.2  PLT 211    Chemistries  Recent Labs  Lab 11/20/17 1515  NA 145  K 4.4  CL 106  CO2 29  GLUCOSE 93   BUN 23  CREATININE 0.99  CALCIUM 10.0    Cardiac Enzymes Recent Labs  Lab 11/20/17 1515  Camp <0.03    Microbiology Results  No results found for this or any previous visit.  RADIOLOGY:  Ct Head Wo Contrast  Result Date: 11/20/2017 CLINICAL DATA:  Double vision and dizziness.  Difficulty walking. EXAM: CT HEAD WITHOUT CONTRAST TECHNIQUE: Contiguous axial images were obtained from the base of the skull through the vertex without intravenous contrast. COMPARISON:  None. FINDINGS: BRAIN: Likely age related mild involutional changes of the brain. No intraparenchymal hemorrhage, mass effect nor midline shift. Periventricular and subcortical white matter hypodensities consistent with chronic small vessel ischemic disease are identified. No acute large vascular territory infarcts. No abnormal extra-axial fluid collections. Basal cisterns are not effaced and midline. VASCULAR: Moderate calcific atherosclerosis of the carotid siphons. SKULL: No skull fracture. No significant scalp soft tissue swelling. SINUSES/ORBITS: The mastoid air-cells are clear. The included paranasal sinuses are well-aerated.The included ocular globes and orbital contents are non-suspicious. OTHER: None. IMPRESSION: Age related involutional changes of the brain without acute intracranial abnormality. Electronically Signed  By: Ashley Royalty M.D.   On: 11/20/2017 20:05   Mr Brain Wo Contrast  Result Date: 11/21/2017 CLINICAL DATA:  Dizziness and vision changes. EXAM: MRI HEAD WITHOUT CONTRAST MRA HEAD WITHOUT CONTRAST TECHNIQUE: Multiplanar, multiecho pulse sequences of the brain and surrounding structures were obtained without intravenous contrast. Angiographic images of the head were obtained using MRA technique without contrast. COMPARISON:  Head CT 11/20/2017 FINDINGS: MRI HEAD FINDINGS BRAIN: There is no acute infarct, acute hemorrhage or mass effect. The midline structures are normal. There are no old infarcts. Mild  white matter hyperintensity, most commonly due to chronic ischemic microangiopathy, though not unexpected for age. Mild generalized volume loss. Susceptibility-sensitive sequences show no chronic microhemorrhage or superficial siderosis. SKULL AND UPPER CERVICAL SPINE: The visualized skull base, calvarium, upper cervical spine and extracranial soft tissues are normal. SINUSES/ORBITS: No fluid levels or advanced mucosal thickening. No mastoid or middle ear effusion. The orbits are normal. MRA HEAD FINDINGS Intracranial internal carotid arteries: Normal. Anterior cerebral arteries: Normal. Middle cerebral arteries: Normal. Posterior communicating arteries: Present bilaterally. Posterior cerebral arteries: Normal. Basilar artery: Normal. Vertebral arteries: Left dominant. Normal. Superior cerebellar arteries: Normal. Inferior cerebellar arteries: Normal. IMPRESSION: 1. No acute intracranial abnormality. 2. Normal aging brain. 3. Normal intracranial MRA. Electronically Signed   By: Ulyses Jarred M.D.   On: 11/21/2017 05:07   US Carotid Bilateral (at [facility] And [facility] Only)  Result Date: 11/21/2017 CLINICAL DATA:  Dizziness EXAM: BILATERAL CAROTID DUPLEX ULTRASOUND TECHNIQUE: Pearline Cables scale imaging, color Doppler and duplex ultrasound were performed of bilateral carotid and vertebral arteries in the neck. COMPARISON:  04/10/2017 FINDINGS: Criteria: Quantification of carotid stenosis is based on velocity parameters that correlate the residual internal carotid diameter with NASCET-based stenosis levels, using the diameter of the distal internal carotid lumen as the denominator for stenosis measurement. The following velocity measurements were obtained: RIGHT ICA: 105/22 cm/sec CCA: 35/00 cm/sec SYSTOLIC ICA/CCA RATIO:  1.2 ECA: 72 cm/sec LEFT ICA: 2104/11 cm/sec CCA: 9381 cm/sec SYSTOLIC ICA/CCA RATIO:  1.2 ECA: 110 cm/sec RIGHT CAROTID ARTERY: Mild noncalcified plaque in the bulb and proximal ICA. No high-grade stenosis.  Normal waveforms and color Doppler signal. RIGHT VERTEBRAL ARTERY:  Normal flow direction and waveform. LEFT CAROTID ARTERY: Mild partially calcified eccentric plaque in the carotid bulb and proximal ICA. No high-grade stenosis. Normal waveforms and color Doppler signal. LEFT VERTEBRAL ARTERY:  Normal flow direction and waveform. IMPRESSION: 1. Mild bilateral carotid bifurcation plaque resulting in less than 50% diameter stenosis. 2.  Antegrade bilateral vertebral arterial flow. Electronically Signed   By: Lucrezia Europe M.D.   On: 11/21/2017 10:39   Mr Jodene Nam Head/brain WE Cm  Result Date: 11/21/2017 CLINICAL DATA:  Dizziness and vision changes. EXAM: MRI HEAD WITHOUT CONTRAST MRA HEAD WITHOUT CONTRAST TECHNIQUE: Multiplanar, multiecho pulse sequences of the brain and surrounding structures were obtained without intravenous contrast. Angiographic images of the head were obtained using MRA technique without contrast. COMPARISON:  Head CT 11/20/2017 FINDINGS: MRI HEAD FINDINGS BRAIN: There is no acute infarct, acute hemorrhage or mass effect. The midline structures are normal. There are no old infarcts. Mild white matter hyperintensity, most commonly due to chronic ischemic microangiopathy, though not unexpected for age. Mild generalized volume loss. Susceptibility-sensitive sequences show no chronic microhemorrhage or superficial siderosis. SKULL AND UPPER CERVICAL SPINE: The visualized skull base, calvarium, upper cervical spine and extracranial soft tissues are normal. SINUSES/ORBITS: No fluid levels or advanced mucosal thickening. No mastoid or middle ear effusion. The orbits are normal. MRA  HEAD FINDINGS Intracranial internal carotid arteries: Normal. Anterior cerebral arteries: Normal. Middle cerebral arteries: Normal. Posterior communicating arteries: Present bilaterally. Posterior cerebral arteries: Normal. Basilar artery: Normal. Vertebral arteries: Left dominant. Normal. Superior cerebellar arteries: Normal.  Inferior cerebellar arteries: Normal. IMPRESSION: 1. No acute intracranial abnormality. 2. Normal aging brain. 3. Normal intracranial MRA. Electronically Signed   By: Ulyses Jarred M.D.   On: 11/21/2017 05:07      Management plans discussed with the patient, family and they are in agreement.  CODE STATUS:     Code Status Orders  (From admission, onward)         Start     Ordered   11/20/17 2234  Do not attempt resuscitation (DNR)  Continuous    Question Answer Comment  In the event of cardiac or respiratory ARREST Do not call a "code blue"   In the event of cardiac or respiratory ARREST Do not perform Intubation, CPR, defibrillation or ACLS   In the event of cardiac or respiratory ARREST Use medication by any route, position, wound care, and other measures to relive pain and suffering. May use oxygen, suction and manual treatment of airway obstruction as needed for comfort.      11/20/17 2234        TOTAL TIME TAKING CARE OF THIS PATIENT: 40 minutes.    Henreitta Leber M.D on 11/21/2017 at 3:01 PM  Between 7am to 6pm - Pager - [phone]  After 6pm go to [email or web address] - Proofreader  [facility]  Office  [phone]  CC: Primary care physician; Idelle Crouch, MD

## 2017-11-21 NOTE — Progress Notes (Signed)
Pt D/C to home with family. Education completed. All questions answered. IV removed intact. VSS.

## 2017-11-21 NOTE — Care Management Obs Status (Signed)
[place] NOTIFICATION   Patient Details  Name: Carrie Rodriguez MRN: [id number] Date of Birth: 02-24-1933   Medicare Observation Status Notification Given:  Yes    Shelbie Ammons, RN 11/21/2017, 12:03 PM

## 2017-11-24 DIAGNOSIS — H2513 Age-related nuclear cataract, bilateral: Secondary | ICD-10-CM | POA: Diagnosis not present

## 2017-11-26 DIAGNOSIS — R918 Other nonspecific abnormal finding of lung field: Secondary | ICD-10-CM | POA: Diagnosis not present

## 2017-11-26 DIAGNOSIS — Z1211 Encounter for screening for malignant neoplasm of colon: Secondary | ICD-10-CM | POA: Diagnosis not present

## 2017-11-26 DIAGNOSIS — I480 Paroxysmal atrial fibrillation: Secondary | ICD-10-CM | POA: Diagnosis not present

## 2017-11-26 DIAGNOSIS — Z0000 Encounter for general adult medical examination without abnormal findings: Secondary | ICD-10-CM | POA: Diagnosis not present

## 2017-11-26 DIAGNOSIS — E7800 Pure hypercholesterolemia, unspecified: Secondary | ICD-10-CM | POA: Diagnosis not present

## 2017-11-26 DIAGNOSIS — H539 Unspecified visual disturbance: Secondary | ICD-10-CM | POA: Diagnosis not present

## 2017-11-27 ENCOUNTER — Other Ambulatory Visit: Payer: Self-pay | Admitting: Internal Medicine

## 2017-11-27 DIAGNOSIS — R918 Other nonspecific abnormal finding of lung field: Secondary | ICD-10-CM

## 2017-12-03 DIAGNOSIS — Z1211 Encounter for screening for malignant neoplasm of colon: Secondary | ICD-10-CM | POA: Diagnosis not present

## 2017-12-04 ENCOUNTER — Ambulatory Visit
Admission: RE | Admit: 2017-12-04 | Discharge: 2017-12-04 | Disposition: A | Discharge status: Home / Self Care | Payer: Medicare HMO | Source: Ambulatory Visit | Attending: Internal Medicine | Admitting: Internal Medicine

## 2017-12-04 DIAGNOSIS — I700 Atherosclerosis of aorta: Secondary | ICD-10-CM | POA: Insufficient documentation

## 2017-12-04 DIAGNOSIS — I2510 Atherosclerotic heart disease of native coronary artery without angina pectoris: Secondary | ICD-10-CM | POA: Diagnosis not present

## 2017-12-04 DIAGNOSIS — R918 Other nonspecific abnormal finding of lung field: Secondary | ICD-10-CM | POA: Insufficient documentation

## 2017-12-29 DIAGNOSIS — M545 Low back pain: Secondary | ICD-10-CM | POA: Diagnosis not present

## 2017-12-31 DIAGNOSIS — H2511 Age-related nuclear cataract, right eye: Secondary | ICD-10-CM | POA: Diagnosis not present

## 2018-01-01 ENCOUNTER — Encounter: Payer: Self-pay | Admitting: *Deleted

## 2018-01-13 ENCOUNTER — Ambulatory Visit: Payer: Medicare HMO | Admitting: Certified Registered Nurse Anesthetist

## 2018-01-13 ENCOUNTER — Encounter
Admission: RE | Disposition: A | Discharge status: Home / Self Care | Payer: Self-pay | Source: Ambulatory Visit | Attending: Ophthalmology

## 2018-01-13 ENCOUNTER — Ambulatory Visit
Admission: RE | Admit: 2018-01-13 | Discharge: 2018-01-13 | Disposition: A | Discharge status: Home / Self Care | Payer: Medicare HMO | Source: Ambulatory Visit | Attending: Ophthalmology | Admitting: Ophthalmology

## 2018-01-13 ENCOUNTER — Encounter: Payer: Self-pay | Admitting: *Deleted

## 2018-01-13 DIAGNOSIS — Z7982 Long term (current) use of aspirin: Secondary | ICD-10-CM | POA: Insufficient documentation

## 2018-01-13 DIAGNOSIS — Z7952 Long term (current) use of systemic steroids: Secondary | ICD-10-CM | POA: Diagnosis not present

## 2018-01-13 DIAGNOSIS — H2511 Age-related nuclear cataract, right eye: Secondary | ICD-10-CM | POA: Insufficient documentation

## 2018-01-13 DIAGNOSIS — Z79899 Other long term (current) drug therapy: Secondary | ICD-10-CM | POA: Insufficient documentation

## 2018-01-13 DIAGNOSIS — Z85828 Personal history of other malignant neoplasm of skin: Secondary | ICD-10-CM | POA: Diagnosis not present

## 2018-01-13 DIAGNOSIS — K219 Gastro-esophageal reflux disease without esophagitis: Secondary | ICD-10-CM | POA: Diagnosis not present

## 2018-01-13 HISTORY — DX: Malignant (primary) neoplasm, unspecified: C80.1

## 2018-01-13 HISTORY — PX: CATARACT EXTRACTION W/PHACO: SHX586

## 2018-01-13 HISTORY — DX: Cardiac arrhythmia, unspecified: I49.9

## 2018-01-13 HISTORY — DX: Palpitations: R00.2

## 2018-01-13 SURGERY — PHACOEMULSIFICATION, CATARACT, WITH IOL INSERTION
Anesthesia: Monitor Anesthesia Care | Site: Eye | Laterality: Right

## 2018-01-13 MED ORDER — ACETAMINOPHEN 325 MG PO TABS
650.0000 mg | ORAL_TABLET | Freq: Once | ORAL | Status: AC
  Administered 2018-01-13: 650 mg via ORAL

## 2018-01-13 MED ORDER — MOXIFLOXACIN HCL 0.5 % OP SOLN
OPHTHALMIC | Status: AC
  Filled 2018-01-13: qty 3

## 2018-01-13 MED ORDER — CARBACHOL 0.01 % IO SOLN
INTRAOCULAR | Status: DC | PRN
  Administered 2018-01-13: .5 mL via INTRAOCULAR

## 2018-01-13 MED ORDER — ARMC OPHTHALMIC DILATING DROPS
OPHTHALMIC | Status: AC
  Administered 2018-01-13: 1 via OPHTHALMIC
  Filled 2018-01-13: qty 0.5

## 2018-01-13 MED ORDER — ACETAMINOPHEN 325 MG PO TABS
ORAL_TABLET | ORAL | Status: AC
  Filled 2018-01-13: qty 2

## 2018-01-13 MED ORDER — NA CHONDROIT SULF-NA HYALURON 40-17 MG/ML IO SOLN
INTRAOCULAR | Status: DC | PRN
  Administered 2018-01-13: 1 mL via INTRAOCULAR

## 2018-01-13 MED ORDER — MOXIFLOXACIN HCL 0.5 % OP SOLN: 1.0000 [drp] | OPHTHALMIC | Status: DC | PRN

## 2018-01-13 MED ORDER — TETRACAINE HCL 0.5 % OP SOLN
1.0000 [drp] | OPHTHALMIC | Status: DC | PRN
  Administered 2018-01-13 (×2): 1 [drp] via OPHTHALMIC

## 2018-01-13 MED ORDER — TETRACAINE HCL 0.5 % OP SOLN
OPHTHALMIC | Status: AC
  Administered 2018-01-13: 1 [drp] via OPHTHALMIC
  Filled 2018-01-13: qty 4

## 2018-01-13 MED ORDER — MOXIFLOXACIN HCL 0.5 % OP SOLN
OPHTHALMIC | Status: DC | PRN
  Administered 2018-01-13: .2 mL via OPHTHALMIC

## 2018-01-13 MED ORDER — SODIUM CHLORIDE 0.9 % IV SOLN
INTRAVENOUS | Status: DC
  Administered 2018-01-13: 07:00:00 via INTRAVENOUS

## 2018-01-13 MED ORDER — MIDAZOLAM HCL 2 MG/2ML IJ SOLN
INTRAMUSCULAR | Status: DC | PRN
  Administered 2018-01-13 (×2): 0.5 mg via INTRAVENOUS

## 2018-01-13 MED ORDER — EPINEPHRINE PF 1 MG/ML IJ SOLN
INTRAOCULAR | Status: DC | PRN
  Administered 2018-01-13: 1 mL via OPHTHALMIC

## 2018-01-13 MED ORDER — POVIDONE-IODINE 5 % OP SOLN
OPHTHALMIC | Status: DC | PRN
  Administered 2018-01-13: 1 via OPHTHALMIC

## 2018-01-13 MED ORDER — MIDAZOLAM HCL 2 MG/2ML IJ SOLN
INTRAMUSCULAR | Status: AC
  Filled 2018-01-13: qty 2

## 2018-01-13 MED ORDER — LIDOCAINE HCL (PF) 4 % IJ SOLN
INTRAOCULAR | Status: DC | PRN
  Administered 2018-01-13: 2 mL via OPHTHALMIC

## 2018-01-13 MED ORDER — ARMC OPHTHALMIC DILATING DROPS
1.0000 "application " | OPHTHALMIC | Status: AC
  Administered 2018-01-13: 1 via OPHTHALMIC

## 2018-01-13 MED ORDER — FENTANYL CITRATE (PF) 100 MCG/2ML IJ SOLN: 25.0000 ug | INTRAMUSCULAR | Status: DC | PRN

## 2018-01-13 MED ORDER — ONDANSETRON HCL 4 MG/2ML IJ SOLN: 4.0000 mg | Freq: Once | INTRAMUSCULAR | Status: DC | PRN

## 2018-01-13 SURGICAL SUPPLY — 16 items
GLOVE BIO SURGEON STRL SZ8 (GLOVE) ×3 IMPLANT
GLOVE BIOGEL M 6.5 STRL (GLOVE) ×3 IMPLANT
GLOVE SURG LX 8.0 MICRO (GLOVE) ×2
GLOVE SURG LX STRL 8.0 MICRO (GLOVE) ×1 IMPLANT
GOWN STRL REUS W/ TWL LRG LVL3 (GOWN DISPOSABLE) ×2 IMPLANT
GOWN STRL REUS W/TWL LRG LVL3 (GOWN DISPOSABLE) ×4
LABEL CATARACT MEDS ST (LABEL) ×3 IMPLANT
LENS IOL TECNIS ITEC 24.5 (Intraocular Lens) ×2 IMPLANT
PACK CATARACT (MISCELLANEOUS) ×3 IMPLANT
PACK CATARACT BRASINGTON LX (MISCELLANEOUS) ×3 IMPLANT
PACK EYE AFTER SURG (MISCELLANEOUS) ×3 IMPLANT
SOL BSS BAG (MISCELLANEOUS) ×3
SOLUTION BSS BAG (MISCELLANEOUS) ×1 IMPLANT
SYR 5ML LL (SYRINGE) ×3 IMPLANT
WATER STERILE IRR 250ML POUR (IV SOLUTION) ×3 IMPLANT
WIPE NON LINTING 3.25X3.25 (MISCELLANEOUS) ×3 IMPLANT

## 2018-01-13 NOTE — Anesthesia Preprocedure Evaluation (Signed)
Anesthesia Evaluation  Patient identified by MRN, date of birth, ID band Patient awake    Reviewed: Allergy & Precautions, H&P , NPO status , Patient's Chart, lab work & pertinent test results  Airway Mallampati: III  TM Distance: <3 FB Neck ROM: limited    Dental  (+) Chipped, Poor Dentition   Pulmonary neg pulmonary ROS, neg shortness of breath,           Cardiovascular Exercise Tolerance: Good (-) Past MI + dysrhythmias Atrial Fibrillation      Neuro/Psych negative neurological ROS  negative psych ROS   GI/Hepatic Neg liver ROS, GERD  Medicated and Controlled,  Endo/Other  negative endocrine ROS  Renal/GU negative Renal ROS  negative genitourinary   Musculoskeletal   Abdominal   Peds  Hematology negative hematology ROS (+)   Anesthesia Other Findings Past Medical History: No date: GERD (gastroesophageal reflux disease) No date: PAF (paroxysmal atrial fibrillation) ([facility]) No date: Pulmonary nodules  Past Surgical History: No date: ABDOMINAL HYSTERECTOMY  BMI    Body Mass Index:  20.25 kg/m      Reproductive/Obstetrics negative OB ROS                             Anesthesia Physical  Anesthesia Plan  ASA: III  Anesthesia Plan: MAC   Post-op Pain Management:    Induction: Intravenous  PONV Risk Score and Plan:   Airway Management Planned: Nasal Cannula  Additional Equipment:   Intra-op Plan:   Post-operative Plan:   Informed Consent: I have reviewed the patients History and Physical, chart, labs and discussed the procedure including the risks, benefits and alternatives for the proposed anesthesia with the patient or authorized representative who has indicated his/her understanding and acceptance.   Dental Advisory Given  Plan Discussed with: Anesthesiologist, CRNA and Surgeon  Anesthesia Plan Comments: (Patient consented for risks of anesthesia including but not  limited to:  - adverse reactions to medications - risk of intubation if required - damage to teeth, lips or other oral mucosa - sore throat or hoarseness - Damage to heart, brain, lungs or loss of life  Patient voiced understanding.)        Anesthesia Quick Evaluation

## 2018-01-13 NOTE — H&P (Signed)
All labs reviewed. Abnormal studies sent to patients PCP when indicated.  Previous H&P reviewed, patient examined, there are NO CHANGES.  Carrie Josey Porfilio12/3/20197:49 AM

## 2018-01-13 NOTE — Anesthesia Postprocedure Evaluation (Signed)
Anesthesia Post Note  Patient: Carrie Rodriguez  Procedure(s) Performed: CATARACT EXTRACTION PHACO AND INTRAOCULAR LENS PLACEMENT (IOC) (Right Eye)  Patient location during evaluation: Short Stay Anesthesia Type: MAC Level of consciousness: awake and alert, oriented and patient cooperative Pain management: satisfactory to patient Vital Signs Assessment: post-procedure vital signs reviewed and stable Respiratory status: spontaneous breathing, respiratory function stable and nonlabored ventilation Cardiovascular status: blood pressure returned to baseline and stable Postop Assessment: no headache and no apparent nausea or vomiting Anesthetic complications: no     Last Vitals:  Vitals:   01/13/18 0655 01/13/18 0814  BP: (!) 157/59 (!) 134/51  Pulse: 72 68  Resp: 18 18  Temp: (!) 36.4 C 36.6 C  SpO2:  100%    Last Pain:  Vitals:   01/13/18 0814  TempSrc: Temporal  PainSc: 5                  Eben Burow

## 2018-01-13 NOTE — H&P (Signed)
All labs reviewed. Abnormal studies sent to patients PCP when indicated.  Previous H&P reviewed, patient examined, there are NO CHANGES.  Carrie Westrup Porfilio12/3/20197:17 AM

## 2018-01-13 NOTE — Discharge Instructions (Addendum)
Eye Surgery Discharge Instructions    Expect mild scratchy sensation or mild soreness. DO NOT RUB YOUR EYE!  The day of surgery:  Minimal physical activity, but bed rest is not required  No reading, computer work, or close hand work  No bending, lifting, or straining.  May watch TV  For 24 hours:  No driving, legal decisions, or alcoholic beverages  Safety precautions  Eat anything you prefer: It is better to start with liquids, then soup then solid foods. *     Solar shield eyeglasses should be worn for comfort in the sunlight/patch while sleeping  Resume all regular medications including aspirin or Coumadin if these were discontinued prior to surgery. You may shower, bathe, shave, or wash your hair. Tylenol may be taken for mild discomfort. FOLLOW DR. PORFILIO'S EYE DROP INSTRUCTION SHEET AS REVIEWED.  Call your doctor if you experience significant pain, nausea, or vomiting, fever > 101 or other signs of infection. [phone] or [phone] Specific instructions:  Follow-up Information    Birder Robson, MD Follow up on 01/14/2018.   Specialty:  Ophthalmology Why:  appointment time @ 8:40 AM Contact information: [place] [address] [phone]

## 2018-01-13 NOTE — Transfer of Care (Signed)
Immediate Anesthesia Transfer of Care Note  Patient: Carrie Rodriguez  Procedure(s) Performed: CATARACT EXTRACTION PHACO AND INTRAOCULAR LENS PLACEMENT (IOC) (Right Eye)  Patient Location: Short Stay  Anesthesia Type:MAC  Level of Consciousness: awake, alert , oriented and patient cooperative  Airway & Oxygen Therapy: Patient Spontanous Breathing  Post-op Assessment: Report given to RN and Post -op Vital signs reviewed and stable  Post vital signs: Reviewed and stable  Last Vitals:  Vitals Value Taken Time  BP    Temp    Pulse    Resp    SpO2      Last Pain:  Vitals:   01/13/18 0655  TempSrc: Temporal  PainSc: 0-No pain         Complications: No apparent anesthesia complications

## 2018-01-13 NOTE — Anesthesia Post-op Follow-up Note (Signed)
Anesthesia QCDR form completed.        

## 2018-01-13 NOTE — Op Note (Signed)
PREOPERATIVE DIAGNOSIS:  Nuclear sclerotic cataract of the right eye.   POSTOPERATIVE DIAGNOSIS:  nuclear sclerotic cataract right eye   OPERATIVE PROCEDURE: Procedure(s): CATARACT EXTRACTION PHACO AND INTRAOCULAR LENS PLACEMENT (IOC)   SURGEON:  Birder Robson, MD.   ANESTHESIA:  Anesthesiologist: Alvin Critchley, MD CRNA: Eben Burow, CRNA  1.      Managed anesthesia care. 2.      0.54ml of Shugarcaine was instilled in the eye following the paracentesis.   COMPLICATIONS:  None.   TECHNIQUE:   Stop and chop   DESCRIPTION OF PROCEDURE:  The patient was examined and consented in the preoperative holding area where the aforementioned topical anesthesia was applied to the right eye and then brought back to the Operating Room where the right eye was prepped and draped in the usual sterile ophthalmic fashion and a lid speculum was placed. A paracentesis was created with the side port blade and the anterior chamber was filled with viscoelastic. A near clear corneal incision was performed with the steel keratome. A continuous curvilinear capsulorrhexis was performed with a cystotome followed by the capsulorrhexis forceps. Hydrodissection and hydrodelineation were carried out with BSS on a blunt cannula. The lens was removed in a stop and chop  technique and the remaining cortical material was removed with the irrigation-aspiration handpiece. The capsular bag was inflated with viscoelastic and the Technis ZCB00  lens was placed in the capsular bag without complication. The remaining viscoelastic was removed from the eye with the irrigation-aspiration handpiece. The wounds were hydrated. The anterior chamber was flushed with Miostat and the eye was inflated to physiologic pressure. 0.36ml of Vigamox was placed in the anterior chamber. The wounds were found to be water tight. The eye was dressed with Vigamox. The patient was given protective glasses to wear throughout the day and a shield with which to  sleep tonight. The patient was also given drops with which to begin a drop regimen today and will follow-up with me in one day. Implant Name Type Inv. Item Serial No. Manufacturer Lot No. LRB No. Used  LENS IOL DIOP 24.5 - [id number] Intraocular Lens LENS IOL DIOP 24.5 [id number] AMO  Right 1   Procedure(s) with comments: CATARACT EXTRACTION PHACO AND INTRAOCULAR LENS PLACEMENT (IOC) (Right) - [place]  01:01 CDE 8.84 Fluid pack lot # [id number]  Electronically signed: Birder Robson 01/13/2018 8:12 AM

## 2018-01-14 ENCOUNTER — Encounter: Payer: Self-pay | Admitting: Ophthalmology

## 2018-02-09 DIAGNOSIS — H2512 Age-related nuclear cataract, left eye: Secondary | ICD-10-CM | POA: Diagnosis not present

## 2018-02-10 ENCOUNTER — Encounter: Payer: Self-pay | Admitting: *Deleted

## 2018-02-17 ENCOUNTER — Ambulatory Visit
Admission: RE | Admit: 2018-02-17 | Discharge: 2018-02-17 | Disposition: A | Discharge status: Home / Self Care | Payer: Medicare HMO | Attending: Ophthalmology | Admitting: Ophthalmology

## 2018-02-17 ENCOUNTER — Ambulatory Visit: Payer: Medicare HMO | Admitting: Anesthesiology

## 2018-02-17 ENCOUNTER — Encounter: Payer: Self-pay | Admitting: *Deleted

## 2018-02-17 ENCOUNTER — Other Ambulatory Visit: Payer: Self-pay

## 2018-02-17 ENCOUNTER — Encounter
Admission: RE | Disposition: A | Discharge status: Home / Self Care | Payer: Self-pay | Source: Home / Self Care | Attending: Ophthalmology

## 2018-02-17 DIAGNOSIS — K219 Gastro-esophageal reflux disease without esophagitis: Secondary | ICD-10-CM | POA: Insufficient documentation

## 2018-02-17 DIAGNOSIS — Z85828 Personal history of other malignant neoplasm of skin: Secondary | ICD-10-CM | POA: Insufficient documentation

## 2018-02-17 DIAGNOSIS — Z79899 Other long term (current) drug therapy: Secondary | ICD-10-CM | POA: Insufficient documentation

## 2018-02-17 DIAGNOSIS — H2512 Age-related nuclear cataract, left eye: Secondary | ICD-10-CM | POA: Diagnosis not present

## 2018-02-17 DIAGNOSIS — I480 Paroxysmal atrial fibrillation: Secondary | ICD-10-CM | POA: Diagnosis not present

## 2018-02-17 HISTORY — DX: Unspecified hearing loss, unspecified ear: H91.90

## 2018-02-17 HISTORY — PX: CATARACT EXTRACTION W/PHACO: SHX586

## 2018-02-17 SURGERY — PHACOEMULSIFICATION, CATARACT, WITH IOL INSERTION
Anesthesia: Monitor Anesthesia Care | Site: Eye | Laterality: Left

## 2018-02-17 MED ORDER — LIDOCAINE HCL (PF) 4 % IJ SOLN
INTRAMUSCULAR | Status: AC
  Filled 2018-02-17: qty 5

## 2018-02-17 MED ORDER — ARMC OPHTHALMIC DILATING DROPS
1.0000 "application " | OPHTHALMIC | Status: AC
  Administered 2018-02-17 (×3): 1 via OPHTHALMIC

## 2018-02-17 MED ORDER — MOXIFLOXACIN HCL 0.5 % OP SOLN: 1.0000 [drp] | OPHTHALMIC | Status: DC | PRN

## 2018-02-17 MED ORDER — NA CHONDROIT SULF-NA HYALURON 40-17 MG/ML IO SOLN
INTRAOCULAR | Status: AC
  Filled 2018-02-17: qty 1

## 2018-02-17 MED ORDER — SODIUM CHLORIDE 0.9 % IV SOLN
INTRAVENOUS | Status: DC
  Administered 2018-02-17: 09:00:00 via INTRAVENOUS

## 2018-02-17 MED ORDER — FENTANYL CITRATE (PF) 100 MCG/2ML IJ SOLN
INTRAMUSCULAR | Status: DC | PRN
  Administered 2018-02-17 (×2): 25 ug via INTRAVENOUS
  Administered 2018-02-17: 50 ug via INTRAVENOUS

## 2018-02-17 MED ORDER — MOXIFLOXACIN HCL 0.5 % OP SOLN
OPHTHALMIC | Status: DC | PRN
  Administered 2018-02-17: 0.2 mL via OPHTHALMIC

## 2018-02-17 MED ORDER — EPINEPHRINE PF 1 MG/ML IJ SOLN
INTRAOCULAR | Status: DC | PRN
  Administered 2018-02-17: 09:00:00 via OPHTHALMIC

## 2018-02-17 MED ORDER — MOXIFLOXACIN HCL 0.5 % OP SOLN
OPHTHALMIC | Status: AC
  Filled 2018-02-17: qty 3

## 2018-02-17 MED ORDER — LIDOCAINE HCL (PF) 4 % IJ SOLN
INTRAOCULAR | Status: DC | PRN
  Administered 2018-02-17: 4 mL via OPHTHALMIC

## 2018-02-17 MED ORDER — CARBACHOL 0.01 % IO SOLN
INTRAOCULAR | Status: DC | PRN
  Administered 2018-02-17: 0.5 mL via INTRAOCULAR

## 2018-02-17 MED ORDER — NA CHONDROIT SULF-NA HYALURON 40-17 MG/ML IO SOLN
INTRAOCULAR | Status: DC | PRN
  Administered 2018-02-17: 1 mL via INTRAOCULAR

## 2018-02-17 MED ORDER — TETRACAINE HCL 0.5 % OP SOLN
1.0000 [drp] | OPHTHALMIC | Status: AC | PRN
  Administered 2018-02-17 (×3): 1 [drp] via OPHTHALMIC

## 2018-02-17 MED ORDER — POVIDONE-IODINE 5 % OP SOLN
OPHTHALMIC | Status: DC | PRN
  Administered 2018-02-17: 1 via OPHTHALMIC

## 2018-02-17 MED ORDER — EPINEPHRINE PF 1 MG/ML IJ SOLN
INTRAMUSCULAR | Status: AC
  Filled 2018-02-17: qty 2

## 2018-02-17 MED ORDER — TETRACAINE HCL 0.5 % OP SOLN
OPHTHALMIC | Status: AC
  Administered 2018-02-17: 1 [drp] via OPHTHALMIC
  Filled 2018-02-17: qty 4

## 2018-02-17 MED ORDER — POVIDONE-IODINE 5 % OP SOLN
OPHTHALMIC | Status: AC
  Filled 2018-02-17: qty 30

## 2018-02-17 MED ORDER — FENTANYL CITRATE (PF) 100 MCG/2ML IJ SOLN
INTRAMUSCULAR | Status: AC
  Filled 2018-02-17: qty 2

## 2018-02-17 MED ORDER — ARMC OPHTHALMIC DILATING DROPS
OPHTHALMIC | Status: AC
  Administered 2018-02-17: 1 via OPHTHALMIC
  Filled 2018-02-17: qty 0.5

## 2018-02-17 SURGICAL SUPPLY — 16 items
GLOVE BIO SURGEON STRL SZ8 (GLOVE) ×3 IMPLANT
GLOVE BIOGEL M 6.5 STRL (GLOVE) ×3 IMPLANT
GLOVE SURG LX 8.0 MICRO (GLOVE) ×2
GLOVE SURG LX STRL 8.0 MICRO (GLOVE) ×1 IMPLANT
GOWN STRL REUS W/ TWL LRG LVL3 (GOWN DISPOSABLE) ×2 IMPLANT
GOWN STRL REUS W/TWL LRG LVL3 (GOWN DISPOSABLE) ×4
LABEL CATARACT MEDS ST (LABEL) ×3 IMPLANT
LENS IOL TECNIS ITEC 24.5 (Intraocular Lens) ×2 IMPLANT
PACK CATARACT (MISCELLANEOUS) ×3 IMPLANT
PACK CATARACT BRASINGTON LX (MISCELLANEOUS) ×3 IMPLANT
PACK EYE AFTER SURG (MISCELLANEOUS) ×3 IMPLANT
SOL BSS BAG (MISCELLANEOUS) ×3
SOLUTION BSS BAG (MISCELLANEOUS) ×1 IMPLANT
SYR 5ML LL (SYRINGE) ×3 IMPLANT
WATER STERILE IRR 250ML POUR (IV SOLUTION) ×3 IMPLANT
WIPE NON LINTING 3.25X3.25 (MISCELLANEOUS) ×3 IMPLANT

## 2018-02-17 NOTE — Anesthesia Preprocedure Evaluation (Signed)
Anesthesia Evaluation  Patient identified by MRN, date of birth, ID band Patient awake    Reviewed: Allergy & Precautions, NPO status , Patient's Chart, lab work & pertinent test results  History of Anesthesia Complications Negative for: history of anesthetic complications  Airway Mallampati: II  TM Distance: >3 FB Neck ROM: Full    Dental  (+) Upper Dentures, Lower Dentures   Pulmonary neg pulmonary ROS, neg sleep apnea, neg COPD,    breath sounds clear to auscultation- rhonchi (-) wheezing      Cardiovascular Exercise Tolerance: Good (-) hypertension(-) CAD, (-) Past MI, (-) Cardiac Stents and (-) CABG + dysrhythmias Atrial Fibrillation  Rhythm:Regular Rate:Normal - Systolic murmurs and - Diastolic murmurs    Neuro/Psych neg Seizures negative neurological ROS  negative psych ROS   GI/Hepatic Neg liver ROS, GERD  ,  Endo/Other  negative endocrine ROSneg diabetes  Renal/GU negative Renal ROS     Musculoskeletal negative musculoskeletal ROS (+)   Abdominal (+) - obese,   Peds  Hematology negative hematology ROS (+)   Anesthesia Other Findings Past Medical History: No date: Cancer ([place])     Comment:  SKIN No date: Dysrhythmia     Comment:  A FIB No date: GERD (gastroesophageal reflux disease) No date: HOH (hard of hearing) No date: PAF (paroxysmal atrial fibrillation) ([facility]) No date: Palpitations No date: Pulmonary nodules   Reproductive/Obstetrics                             Anesthesia Physical Anesthesia Plan  ASA: II  Anesthesia Plan: MAC   Post-op Pain Management:    Induction: Intravenous  PONV Risk Score and Plan: 2 and Midazolam  Airway Management Planned: Natural Airway  Additional Equipment:   Intra-op Plan:   Post-operative Plan:   Informed Consent: I have reviewed the patients History and Physical, chart, labs and discussed the procedure including the risks,  benefits and alternatives for the proposed anesthesia with the patient or authorized representative who has indicated his/her understanding and acceptance.     Plan Discussed with: CRNA and Anesthesiologist  Anesthesia Plan Comments:         Anesthesia Quick Evaluation

## 2018-02-17 NOTE — Discharge Instructions (Signed)
Eye Surgery Discharge Instructions    Expect mild scratchy sensation or mild soreness. DO NOT RUB YOUR EYE!  The day of surgery:  Minimal physical activity, but bed rest is not required  No reading, computer work, or close hand work  No bending, lifting, or straining.  May watch TV  For 24 hours:  No driving, legal decisions, or alcoholic beverages  Safety precautions  Eat anything you prefer: It is better to start with liquids, then soup then solid foods.  _____ Eye patch should be worn until postoperative exam tomorrow.  ____ Solar shield eyeglasses should be worn for comfort in the sunlight/patch while sleeping  Resume all regular medications including aspirin or Coumadin if these were discontinued prior to surgery. You may shower, bathe, shave, or wash your hair. Tylenol may be taken for mild discomfort.  Call your doctor if you experience significant pain, nausea, or vomiting, fever > 101 or other signs of infection. [phone] or [phone] Specific instructions:  Follow-up Information    Birder Robson, MD Follow up on 02/18/2018.   Specialty:  Ophthalmology Why:  8:40 Contact information: [address] [place] [address] [phone]

## 2018-02-17 NOTE — Op Note (Signed)
PREOPERATIVE DIAGNOSIS:  Nuclear sclerotic cataract of the left eye.   POSTOPERATIVE DIAGNOSIS:  Nuclear sclerotic cataract of the left eye.   OPERATIVE PROCEDURE: Procedure(s): CATARACT EXTRACTION PHACO AND INTRAOCULAR LENS PLACEMENT (IOC) LEFT   SURGEON:  Birder Robson, MD.   ANESTHESIA:  Anesthesiologist: Emmie Niemann, MD CRNA: Johnna Acosta, CRNA  1.      Managed anesthesia care. 2.     0.70ml of Shugarcaine was instilled following the paracentesis   COMPLICATIONS:  None.   TECHNIQUE:   Stop and chop   DESCRIPTION OF PROCEDURE:  The patient was examined and consented in the preoperative holding area where the aforementioned topical anesthesia was applied to the left eye and then brought back to the Operating Room where the left eye was prepped and draped in the usual sterile ophthalmic fashion and a lid speculum was placed. A paracentesis was created with the side port blade and the anterior chamber was filled with viscoelastic. A near clear corneal incision was performed with the steel keratome. A continuous curvilinear capsulorrhexis was performed with a cystotome followed by the capsulorrhexis forceps. Hydrodissection and hydrodelineation were carried out with BSS on a blunt cannula. The lens was removed in a stop and chop  technique and the remaining cortical material was removed with the irrigation-aspiration handpiece. The capsular bag was inflated with viscoelastic and the Technis ZCB00 lens was placed in the capsular bag without complication. The remaining viscoelastic was removed from the eye with the irrigation-aspiration handpiece. The wounds were hydrated. The anterior chamber was flushed with Miostat and the eye was inflated to physiologic pressure. 0.79ml Vigamox was placed in the anterior chamber. The wounds were found to be water tight. The eye was dressed with Vigamox. The patient was given protective glasses to wear throughout the day and a shield with which to  sleep tonight. The patient was also given drops with which to begin a drop regimen today and will follow-up with me in one day. Implant Name Type Inv. Item Serial No. Manufacturer Lot No. LRB No. Used  LENS IOL DIOP 24.5 - [id number] Intraocular Lens LENS IOL DIOP 24.5 [id number] 1811 AMO  Left 1    Procedure(s) with comments: CATARACT EXTRACTION PHACO AND INTRAOCULAR LENS PLACEMENT (IOC) LEFT (Left) - [place] 00:36.5 CDE 5.01 Fluid pack lot # [id number]  Electronically signed: Birder Robson 02/17/2018 9:40 AM

## 2018-02-17 NOTE — H&P (Signed)
All labs reviewed. Abnormal studies sent to patients PCP when indicated.  Previous H&P reviewed, patient examined, there are NO CHANGES.  Carrie Rodriguez Porfilio1/7/20209:16 AM

## 2018-02-17 NOTE — Transfer of Care (Signed)
Immediate Anesthesia Transfer of Care Note  Patient: Carrie Rodriguez  Procedure(s) Performed: CATARACT EXTRACTION PHACO AND INTRAOCULAR LENS PLACEMENT (IOC) LEFT (Left Eye)  Patient Location: PACU  Anesthesia Type:MAC  Level of Consciousness: awake, alert  and oriented  Airway & Oxygen Therapy: Patient Spontanous Breathing  Post-op Assessment: Report given to RN and Post -op Vital signs reviewed and stable  Post vital signs: Reviewed and stable  Last Vitals:  Vitals Value Taken Time  BP    Temp    Pulse    Resp    SpO2      Last Pain:  Vitals:   02/17/18 0811  TempSrc: Temporal  PainSc: 0-No pain         Complications: No apparent anesthesia complications

## 2018-02-17 NOTE — Anesthesia Procedure Notes (Signed)
Procedure Name: MAC Date/Time: 02/17/2018 9:23 AM Performed by: Johnna Acosta, CRNA Pre-anesthesia Checklist: Patient identified, Emergency Drugs available, Suction available, Patient being monitored and Timeout performed Patient Re-evaluated:Patient Re-evaluated prior to induction Oxygen Delivery Method: Nasal cannula Preoxygenation: Pre-oxygenation with 100% oxygen Induction Type: IV induction

## 2018-02-17 NOTE — Anesthesia Post-op Follow-up Note (Signed)
Anesthesia QCDR form completed.        

## 2018-02-18 ENCOUNTER — Encounter: Payer: Self-pay | Admitting: Ophthalmology

## 2018-02-18 NOTE — Anesthesia Postprocedure Evaluation (Signed)
Anesthesia Post Note  Patient: Dessire Grimes Jeudy  Procedure(s) Performed: CATARACT EXTRACTION PHACO AND INTRAOCULAR LENS PLACEMENT (IOC) LEFT (Left Eye)  Patient location during evaluation: PACU Anesthesia Type: MAC Level of consciousness: awake and alert Pain management: pain level controlled Vital Signs Assessment: post-procedure vital signs reviewed and stable Respiratory status: spontaneous breathing, nonlabored ventilation, respiratory function stable and patient connected to nasal cannula oxygen Cardiovascular status: stable and blood pressure returned to baseline Postop Assessment: no apparent nausea or vomiting Anesthetic complications: no     Last Vitals:  Vitals:   02/17/18 0942 02/17/18 0952  BP: (!) 164/55 (!) 183/53  Pulse: (!) 48 (!) 48  Resp:  16  Temp: 36.8 C   SpO2: 100% 98%    Last Pain:  Vitals:   02/17/18 0952  TempSrc:   PainSc: 0-No pain                 Zoey Bidwell

## 2018-05-26 ENCOUNTER — Other Ambulatory Visit: Payer: Self-pay | Admitting: Internal Medicine

## 2018-05-26 DIAGNOSIS — Z1231 Encounter for screening mammogram for malignant neoplasm of breast: Secondary | ICD-10-CM

## 2018-07-29 ENCOUNTER — Other Ambulatory Visit: Payer: Self-pay

## 2018-07-29 ENCOUNTER — Ambulatory Visit
Admission: RE | Admit: 2018-07-29 | Discharge: 2018-07-29 | Disposition: A | Discharge status: Home / Self Care | Payer: Medicare HMO | Source: Ambulatory Visit | Attending: Internal Medicine | Admitting: Internal Medicine

## 2018-07-29 DIAGNOSIS — Z1231 Encounter for screening mammogram for malignant neoplasm of breast: Secondary | ICD-10-CM | POA: Insufficient documentation

## 2018-12-11 DIAGNOSIS — D508 Other iron deficiency anemias: Secondary | ICD-10-CM | POA: Diagnosis not present

## 2018-12-11 DIAGNOSIS — M542 Cervicalgia: Secondary | ICD-10-CM | POA: Diagnosis not present

## 2018-12-11 DIAGNOSIS — K219 Gastro-esophageal reflux disease without esophagitis: Secondary | ICD-10-CM | POA: Diagnosis not present

## 2018-12-11 DIAGNOSIS — Z0000 Encounter for general adult medical examination without abnormal findings: Secondary | ICD-10-CM | POA: Diagnosis not present

## 2018-12-11 DIAGNOSIS — Z79899 Other long term (current) drug therapy: Secondary | ICD-10-CM | POA: Diagnosis not present

## 2018-12-11 DIAGNOSIS — Z87891 Personal history of nicotine dependence: Secondary | ICD-10-CM | POA: Diagnosis not present

## 2018-12-11 DIAGNOSIS — E7800 Pure hypercholesterolemia, unspecified: Secondary | ICD-10-CM | POA: Diagnosis not present

## 2018-12-11 DIAGNOSIS — I480 Paroxysmal atrial fibrillation: Secondary | ICD-10-CM | POA: Diagnosis not present

## 2018-12-11 DIAGNOSIS — R918 Other nonspecific abnormal finding of lung field: Secondary | ICD-10-CM | POA: Diagnosis not present

## 2018-12-18 DIAGNOSIS — M47816 Spondylosis without myelopathy or radiculopathy, lumbar region: Secondary | ICD-10-CM | POA: Diagnosis not present

## 2018-12-18 DIAGNOSIS — M25551 Pain in right hip: Secondary | ICD-10-CM | POA: Diagnosis not present

## 2018-12-18 DIAGNOSIS — Z1211 Encounter for screening for malignant neoplasm of colon: Secondary | ICD-10-CM | POA: Diagnosis not present

## 2019-03-02 DIAGNOSIS — M5416 Radiculopathy, lumbar region: Secondary | ICD-10-CM | POA: Diagnosis not present

## 2019-03-02 DIAGNOSIS — M5136 Other intervertebral disc degeneration, lumbar region: Secondary | ICD-10-CM | POA: Diagnosis not present

## 2019-03-02 DIAGNOSIS — M48062 Spinal stenosis, lumbar region with neurogenic claudication: Secondary | ICD-10-CM | POA: Diagnosis not present

## 2019-03-09 ENCOUNTER — Other Ambulatory Visit: Payer: Self-pay | Admitting: Internal Medicine

## 2019-03-09 DIAGNOSIS — Z87891 Personal history of nicotine dependence: Secondary | ICD-10-CM | POA: Diagnosis not present

## 2019-03-09 DIAGNOSIS — R1084 Generalized abdominal pain: Secondary | ICD-10-CM

## 2019-03-22 ENCOUNTER — Other Ambulatory Visit: Payer: Self-pay

## 2019-03-22 ENCOUNTER — Ambulatory Visit
Admission: RE | Admit: 2019-03-22 | Discharge: 2019-03-22 | Disposition: A | Discharge status: Home / Self Care | Payer: Medicare HMO | Source: Ambulatory Visit | Attending: Internal Medicine | Admitting: Internal Medicine

## 2019-03-22 DIAGNOSIS — R1084 Generalized abdominal pain: Secondary | ICD-10-CM | POA: Insufficient documentation

## 2019-03-22 DIAGNOSIS — R109 Unspecified abdominal pain: Secondary | ICD-10-CM | POA: Diagnosis not present

## 2019-03-22 MED ORDER — IOHEXOL 300 MG/ML  SOLN
100.0000 mL | Freq: Once | INTRAMUSCULAR | Status: AC | PRN
  Administered 2019-03-22: 100 mL via INTRAVENOUS

## 2019-03-23 DIAGNOSIS — M48062 Spinal stenosis, lumbar region with neurogenic claudication: Secondary | ICD-10-CM | POA: Diagnosis not present

## 2019-03-23 DIAGNOSIS — M5136 Other intervertebral disc degeneration, lumbar region: Secondary | ICD-10-CM | POA: Diagnosis not present

## 2019-03-23 DIAGNOSIS — M5416 Radiculopathy, lumbar region: Secondary | ICD-10-CM | POA: Diagnosis not present

## 2019-06-10 DIAGNOSIS — H43813 Vitreous degeneration, bilateral: Secondary | ICD-10-CM | POA: Diagnosis not present

## 2019-06-14 DIAGNOSIS — E785 Hyperlipidemia, unspecified: Secondary | ICD-10-CM | POA: Diagnosis not present

## 2019-06-14 DIAGNOSIS — Z0000 Encounter for general adult medical examination without abnormal findings: Secondary | ICD-10-CM | POA: Diagnosis not present

## 2019-06-14 DIAGNOSIS — Z79899 Other long term (current) drug therapy: Secondary | ICD-10-CM | POA: Diagnosis not present

## 2019-06-14 DIAGNOSIS — Z87891 Personal history of nicotine dependence: Secondary | ICD-10-CM | POA: Diagnosis not present

## 2019-06-14 DIAGNOSIS — I10 Essential (primary) hypertension: Secondary | ICD-10-CM | POA: Diagnosis not present

## 2019-06-14 DIAGNOSIS — R109 Unspecified abdominal pain: Secondary | ICD-10-CM | POA: Diagnosis not present

## 2019-06-14 DIAGNOSIS — I480 Paroxysmal atrial fibrillation: Secondary | ICD-10-CM | POA: Diagnosis not present

## 2019-06-28 ENCOUNTER — Other Ambulatory Visit: Payer: Self-pay | Admitting: Internal Medicine

## 2019-06-28 DIAGNOSIS — Z1231 Encounter for screening mammogram for malignant neoplasm of breast: Secondary | ICD-10-CM

## 2019-07-26 ENCOUNTER — Other Ambulatory Visit: Payer: Self-pay

## 2019-07-27 ENCOUNTER — Encounter: Payer: Self-pay | Admitting: Gastroenterology

## 2019-07-27 ENCOUNTER — Ambulatory Visit: Payer: Medicare HMO | Admitting: Gastroenterology

## 2019-07-27 ENCOUNTER — Other Ambulatory Visit: Payer: Self-pay

## 2019-07-27 VITALS — BP 169/71 | HR 83 | Temp 97.7°F | Ht 64.0 in | Wt 126.4 lb

## 2019-07-27 DIAGNOSIS — D649 Anemia, unspecified: Secondary | ICD-10-CM | POA: Diagnosis not present

## 2019-07-27 DIAGNOSIS — R140 Abdominal distension (gaseous): Secondary | ICD-10-CM

## 2019-07-27 DIAGNOSIS — K2100 Gastro-esophageal reflux disease with esophagitis, without bleeding: Secondary | ICD-10-CM | POA: Diagnosis not present

## 2019-07-27 DIAGNOSIS — K5909 Other constipation: Secondary | ICD-10-CM | POA: Diagnosis not present

## 2019-07-27 DIAGNOSIS — R1031 Right lower quadrant pain: Secondary | ICD-10-CM | POA: Diagnosis not present

## 2019-07-27 MED ORDER — NA SULFATE-K SULFATE-MG SULF 17.5-3.13-1.6 GM/177ML PO SOLN
354.0000 mL | Freq: Once | ORAL | 0 refills | Status: AC
Start: 2019-07-27 — End: 2019-07-27

## 2019-07-27 NOTE — Patient Instructions (Signed)
1.  Take a bottle of magnesium citrate today 2.  Start MiraLAX 1 capful 1-2 times daily 3.  Start Metamucil 1-2 times daily 4.  Adequate intake of water 5.  High-fiber diet as described below  Please call our office to speak with my nurse Ulyess Blossom [phone] during business hours from 8am to 4pm if you have any questions/concerns. During after hours, you will be redirected to on call GI physician. For any emergency please call 911 or go the nearest emergency room.    Carrie Darby, MD [address]  [place], [place] [address]  Main: [phone]  Fax: [phone]   High-Fiber Diet Fiber, also called dietary fiber, is a type of carbohydrate that is found in fruits, vegetables, whole grains, and beans. A high-fiber diet can have many health benefits. Your health care provider may recommend a high-fiber diet to help:  Prevent constipation. Fiber can make your bowel movements more regular.  Lower your cholesterol.  Relieve the following conditions: ? Swelling of veins in the anus (hemorrhoids). ? Swelling and irritation (inflammation) of specific areas of the digestive tract (uncomplicated diverticulosis). ? A problem of the large intestine (colon) that sometimes causes pain and diarrhea (irritable bowel syndrome, IBS).  Prevent overeating as part of a weight-loss plan.  Prevent heart disease, type 2 diabetes, and certain cancers. What is my plan? The recommended daily fiber intake in grams (g) includes:  38 g for men age 84 or younger.  30 g for men over age 73.  40 g for women age 26 or younger.  21 g for women over age 44. You can get the recommended daily intake of dietary fiber by:  Eating a variety of fruits, vegetables, grains, and beans.  Taking a fiber supplement, if it is not possible to get enough fiber through your diet. What do I need to know about a high-fiber diet?  It is better to get fiber through food sources rather than from fiber  supplements. There is not a lot of research about how effective supplements are.  Always check the fiber content on the nutrition facts label of any prepackaged food. Look for foods that contain 5 g of fiber or more per serving.  Talk with a diet and nutrition specialist (dietitian) if you have questions about specific foods that are recommended or not recommended for your medical condition, especially if those foods are not listed below.  Gradually increase how much fiber you consume. If you increase your intake of dietary fiber too quickly, you may have bloating, cramping, or gas.  Drink plenty of water. Water helps you to digest fiber. What are tips for following this plan?  Eat a wide variety of high-fiber foods.  Make sure that half of the grains that you eat each day are whole grains.  Eat breads and cereals that are made with whole-grain flour instead of refined flour or white flour.  Eat brown rice, bulgur wheat, or millet instead of white rice.  Start the day with a breakfast that is high in fiber, such as a cereal that contains 5 g of fiber or more per serving.  Use beans in place of meat in soups, salads, and pasta dishes.  Eat high-fiber snacks, such as berries, raw vegetables, nuts, and popcorn.  Choose whole fruits and vegetables instead of processed forms like juice or sauce. What foods can I eat?  Fruits Berries. Pears. Apples. Oranges. Avocado. Prunes and raisins. Dried figs. Vegetables Sweet potatoes.  Spinach. Kale. Artichokes. Cabbage. Broccoli. Cauliflower. Green peas. Carrots. Squash. Grains Whole-grain breads. Multigrain cereal. Oats and oatmeal. Brown rice. Barley. Bulgur wheat. [place]. Quinoa. Bran muffins. Popcorn. Rye wafer crackers. Meats and other proteins Navy, kidney, and pinto beans. Soybeans. Split peas. Lentils. Nuts and seeds. Dairy Fiber-fortified yogurt. Beverages Fiber-fortified soy milk. Fiber-fortified orange juice. Other foods Fiber  bars. The items listed above may not be a complete list of recommended foods and beverages. Contact a dietitian for more options. What foods are not recommended? Fruits Fruit juice. Cooked, strained fruit. Vegetables Fried potatoes. Canned vegetables. Well-cooked vegetables. Grains White bread. Pasta made with refined flour. White rice. Meats and other proteins Fatty cuts of meat. Fried chicken or fried fish. Dairy Milk. Yogurt. Cream cheese. Sour cream. Fats and oils Butters. Beverages Soft drinks. Other foods Cakes and pastries. The items listed above may not be a complete list of foods and beverages to avoid. Contact a dietitian for more information. Summary  Fiber is a type of carbohydrate. It is found in fruits, vegetables, whole grains, and beans.  There are many health benefits of eating a high-fiber diet, such as preventing constipation, lowering blood cholesterol, helping with weight loss, and reducing your risk of heart disease, diabetes, and certain cancers.  Gradually increase your intake of fiber. Increasing too fast can result in cramping, bloating, and gas. Drink plenty of water while you increase your fiber.  The best sources of fiber include whole fruits and vegetables, whole grains, nuts, seeds, and beans. This information is not intended to replace advice given to you by your health care provider. Make sure you discuss any questions you have with your health care provider. Document Revised: 12/02/2016 Document Reviewed: 12/02/2016 Elsevier Patient Education  2020 [employer].

## 2019-07-27 NOTE — Progress Notes (Signed)
Carrie Darby, MD [address]  [place]  [place], [place] [address]  Main: [phone]  Fax: [phone]    Gastroenterology Consultation  Referring Provider:     Idelle Crouch, MD Primary Care Physician:  Idelle Crouch, MD Primary Gastroenterologist:  Dr. Cephas Rodriguez Reason for Consultation:    Abdominal pain, constipation, anemia        HPI:   Carrie Rodriguez is a 86 y.o. female referred by Dr. [name], [name], MD  for consultation & management of new onset of anemia and severe constipation.  Abdominal pain, constipation: Patient reports for last 6 months, she has been experiencing constant right lower quadrant pain associated with bloating.  She is not able to fit into her regular pants which is concerning to her.  She does report having bowel movements every day but hard associated with straining.  She denies any rectal bleeding. She does report history of constipation for several years.  She thinks she does not drink enough water.  She does walk 3 miles a day, does toning about 3 times a week.  She lost her husband about 2 years ago.  She lives alone but her children are close by and supportive.  She does have 3 grandchildren as well.  She denies any personal stress in her life.  She denies any weight loss She underwent CT abdomen and pelvis with contrast which revealed significant stool burden.  TSH is normal.  Patient found to have chronic normocytic anemia since 08/2016, normal iron panel.  No evidence of B12 deficiency she states she has Metamucil which she has not tried yet  Chronic GERD: Patient has history of erosive esophagitis, currently not on PPI.  She was previously on AcipHex.  She does report severe heartburn, belching and regurgitation.  She underwent EGD in 2019 by Dr. Gustavo Lah  Patient is noticed to be nervous and anxious in office today.  She sees that she is generally like this during doctors' visits  NSAIDs:  None  Antiplts/Anticoagulants/Anti thrombotics: None  GI Procedures:  Colonoscopy more than 10 years ago Upper endoscopy 07/11/2017 - LA Grade A reflux esophagitis. Biopsied. - Abnormal esophageal motility. - Small hiatal hernia. - Normal stomach. - Normal examined duodenum. DIAGNOSIS:  A. GE JUNCTION; COLD BIOPSY:  - REFLUX GASTROESOPHAGITIS.  - NEGATIVE FOR GOBLET CELLS, DYSPLASIA AND MALIGNANCY.  Past Medical History:  Diagnosis Date  . Cancer ([place])    SKIN  . Dysrhythmia    A FIB  . GERD (gastroesophageal reflux disease)   . HOH (hard of hearing)   . PAF (paroxysmal atrial fibrillation) ([place])   . Palpitations   . Pulmonary nodules     Past Surgical History:  Procedure Laterality Date  . ABDOMINAL HYSTERECTOMY    . CATARACT EXTRACTION W/PHACO Right 01/13/2018   Procedure: CATARACT EXTRACTION PHACO AND INTRAOCULAR LENS PLACEMENT ([place]);  Surgeon: Birder Robson, MD;  Location: [facility] ORS;  Service: Ophthalmology;  Laterality: Right;  [place]  01:01 CDE 8.84 Fluid pack lot # [id number]  . CATARACT EXTRACTION W/PHACO Left 02/17/2018   Procedure: CATARACT EXTRACTION PHACO AND INTRAOCULAR LENS PLACEMENT ([place]) LEFT;  Surgeon: Birder Robson, MD;  Location: [facility] ORS;  Service: Ophthalmology;  Laterality: Left;  [place] 00:36.5 CDE 5.01 Fluid pack lot # [id number]  . ESOPHAGOGASTRODUODENOSCOPY (EGD) WITH PROPOFOL N/A 07/11/2017   Procedure: ESOPHAGOGASTRODUODENOSCOPY (EGD) WITH PROPOFOL;  Surgeon: Lollie Sails, MD;  Location: [facility] ENDOSCOPY;  Service: Endoscopy;  Laterality: N/A;    Current  Outpatient Medications:  .  acetaminophen (TYLENOL) 500 MG tablet, Take 1,000 mg by mouth daily as needed (arthritis pain)., Disp: , Rfl:  .  aspirin EC 81 MG tablet, Take 81 mg by mouth daily at 2 PM. , Disp: , Rfl:  .  Biotin 5 MG CAPS, Take 5 mg by mouth daily., Disp: , Rfl:  .  Ergocalciferol (VITAMIN D2) 10 MCG (400 UNIT) TABS, Take 400 Units by mouth daily., Disp: , Rfl:  .  folic acid  (FOLVITE) 732 MCG tablet, Take 400 mcg by mouth daily., Disp: , Rfl:  .  Magnesium 250 MG TABS, Take 250 mg by mouth daily., Disp: , Rfl:  .  Multiple Vitamin (MULTIVITAMIN WITH MINERALS) TABS tablet, Take 1 tablet by mouth daily., Disp: , Rfl:  .  propranolol (INDERAL) 10 MG tablet, Take by mouth., Disp: , Rfl:  .  Na Sulfate-K Sulfate-Mg Sulf 17.5-3.13-1.6 GM/177ML SOLN, Take 354 mLs by mouth once for 1 dose., Disp: 354 mL, Rfl: 0   Family History  Problem Relation Age of Onset  . Breast cancer Neg Hx      Social History   Tobacco Use  . Smoking status: Never Smoker  . Smokeless tobacco: Never Used  Vaping Use  . Vaping Use: Never used  Substance Use Topics  . Alcohol use: Not Currently    Comment: 1 glass of wine a year   . Drug use: Not Currently    Allergies as of 07/27/2019  . (No Known Allergies)    Review of Systems:    All systems reviewed and negative except where noted in HPI.   Physical Exam:  BP (!) 169/71 (BP Location: Left Arm, Patient Position: Sitting, Cuff Size: Normal)   Pulse 83   Temp 97.7 F (36.5 C) (Oral)   Ht 5\' 4"  (1.626 m)   Wt 126 lb 6 oz (57.3 kg)   BMI 21.69 kg/m  No LMP recorded. Patient has had a hysterectomy.  General:   Alert,  Well-developed, well-nourished, pleasant and cooperative in NAD Head:  Normocephalic and atraumatic. Eyes:  Sclera clear, no icterus.   Conjunctiva pink. Ears:  Normal auditory acuity. Nose:  No deformity, discharge, or lesions. Mouth:  No deformity or lesions,oropharynx pink & moist. Neck:  Supple; no masses or thyromegaly. Lungs:  Respirations even and unlabored.  Clear throughout to auscultation.   No wheezes, crackles, or rhonchi. No acute distress. Heart:  Regular rate and rhythm; no murmurs, clicks, rubs, or gallops. Abdomen:  Normal bowel sounds. Soft, mild right lower quadrant tenderness, moderately distended, tympanic to percussion without masses, hepatosplenomegaly or hernias noted.  No guarding or  rebound tenderness.   Rectal: Not performed Msk:  Symmetrical without gross deformities. Good, equal movement & strength bilaterally. Pulses:  Normal pulses noted. Extremities:  No clubbing or edema.  No cyanosis. Neurologic:  Alert and oriented x3;  grossly normal neurologically. Skin:  Intact without significant lesions or rashes. No jaundice. Psych:  Alert and cooperative. Normal mood and affect.  Imaging Studies: Reviewed  Assessment and Plan:   ROSALYN ARCHAMBAULT is a 86 y.o. female with history of chronic constipation, GERD with esophagitis is seen in consultation for 6 months history of right lower quadrant pain associated with abdominal bloating  Right lower quadrant pain with abdominal bloating, likely secondary to worsening constipation CT revealed significant stool burden Discussed about trial of medicine citrate today Start MiraLAX twice daily Start Metamucil 1-2 times daily Adequate intake of water High-fiber diet, information  provided Given that she had a colonoscopy more than 10 years ago, and she is otherwise healthy and active, functionally independent, recommend diagnostic colonoscopy to rule out any malignancy.  Will use pediatric colonoscope for this procedure  Chronic GERD with esophagitis Recommend upper endoscopy  Normocytic anemia, unclear etiology Recommend iron panel, B12 and folate panel Recommend H. pylori breath test, celiac disease panel   Follow up in 2 to 3 months   Carrie Darby, MD

## 2019-07-29 ENCOUNTER — Telehealth: Payer: Self-pay

## 2019-07-29 LAB — IRON,TIBC AND FERRITIN PANEL
Ferritin: 85 ng/mL (ref 15–150)
Iron Saturation: 22 % (ref 15–55)
Iron: 62 ug/dL (ref 27–139)
Total Iron Binding Capacity: 285 ug/dL (ref 250–450)
UIBC: 223 ug/dL (ref 118–369)

## 2019-07-29 LAB — CELIAC DISEASE PANEL
Endomysial IgA: NEGATIVE
IgA/Immunoglobulin A, Serum: 115 mg/dL (ref 64–422)
Transglutaminase IgA: <2 U/mL (ref 0–3)

## 2019-07-29 LAB — H. PYLORI BREATH TEST: H pylori Breath Test: NEGATIVE

## 2019-07-29 LAB — B12 AND FOLATE PANEL
Folate: >20 ng/mL (ref >3.0)
Vitamin B-12: 765 pg/mL (ref 232–1245)

## 2019-07-29 NOTE — Telephone Encounter (Signed)
Called and left a message for call back  

## 2019-07-29 NOTE — Telephone Encounter (Signed)
-----   Message from Lin Landsman, MD sent at 07/29/2019  8:10 AM EDT ----- Her labs came back normal  RV

## 2019-07-29 NOTE — Telephone Encounter (Signed)
Patient verbalized understanding  

## 2019-08-03 ENCOUNTER — Ambulatory Visit
Admission: RE | Admit: 2019-08-03 | Discharge: 2019-08-03 | Disposition: A | Discharge status: Home / Self Care | Payer: Medicare HMO | Source: Ambulatory Visit | Attending: Internal Medicine | Admitting: Internal Medicine

## 2019-08-03 DIAGNOSIS — Z1231 Encounter for screening mammogram for malignant neoplasm of breast: Secondary | ICD-10-CM | POA: Diagnosis not present

## 2019-08-10 ENCOUNTER — Ambulatory Visit: Payer: Medicare HMO | Admitting: Gastroenterology

## 2019-08-26 ENCOUNTER — Other Ambulatory Visit: Admission: RE | Admit: 2019-08-26 | Payer: Medicare HMO | Source: Ambulatory Visit

## 2019-08-27 ENCOUNTER — Other Ambulatory Visit
Admission: RE | Admit: 2019-08-27 | Discharge: 2019-08-27 | Disposition: A | Discharge status: Home / Self Care | Payer: Medicare HMO | Source: Ambulatory Visit | Attending: Gastroenterology | Admitting: Gastroenterology

## 2019-08-27 ENCOUNTER — Other Ambulatory Visit: Payer: Self-pay

## 2019-08-27 DIAGNOSIS — Z01812 Encounter for preprocedural laboratory examination: Secondary | ICD-10-CM | POA: Diagnosis not present

## 2019-08-27 DIAGNOSIS — Z20822 Contact with and (suspected) exposure to covid-19: Secondary | ICD-10-CM | POA: Insufficient documentation

## 2019-08-27 LAB — SARS CORONAVIRUS 2 (TAT 6-24 HRS): SARS Coronavirus 2: NEGATIVE

## 2019-08-30 ENCOUNTER — Ambulatory Visit
Admission: RE | Admit: 2019-08-30 | Discharge: 2019-08-30 | Disposition: A | Discharge status: Home / Self Care | Payer: Medicare HMO | Attending: Gastroenterology | Admitting: Gastroenterology

## 2019-08-30 ENCOUNTER — Ambulatory Visit: Payer: Medicare HMO | Admitting: Anesthesiology

## 2019-08-30 ENCOUNTER — Encounter
Admission: RE | Disposition: A | Discharge status: Home / Self Care | Payer: Self-pay | Source: Home / Self Care | Attending: Gastroenterology

## 2019-08-30 ENCOUNTER — Encounter: Payer: Self-pay | Admitting: Gastroenterology

## 2019-08-30 ENCOUNTER — Other Ambulatory Visit: Payer: Self-pay

## 2019-08-30 DIAGNOSIS — Z7982 Long term (current) use of aspirin: Secondary | ICD-10-CM | POA: Diagnosis not present

## 2019-08-30 DIAGNOSIS — R1031 Right lower quadrant pain: Secondary | ICD-10-CM | POA: Insufficient documentation

## 2019-08-30 DIAGNOSIS — K5730 Diverticulosis of large intestine without perforation or abscess without bleeding: Secondary | ICD-10-CM | POA: Diagnosis not present

## 2019-08-30 DIAGNOSIS — Z79899 Other long term (current) drug therapy: Secondary | ICD-10-CM | POA: Insufficient documentation

## 2019-08-30 DIAGNOSIS — H9190 Unspecified hearing loss, unspecified ear: Secondary | ICD-10-CM | POA: Insufficient documentation

## 2019-08-30 DIAGNOSIS — K633 Ulcer of intestine: Secondary | ICD-10-CM | POA: Insufficient documentation

## 2019-08-30 DIAGNOSIS — I480 Paroxysmal atrial fibrillation: Secondary | ICD-10-CM | POA: Insufficient documentation

## 2019-08-30 DIAGNOSIS — K529 Noninfective gastroenteritis and colitis, unspecified: Secondary | ICD-10-CM | POA: Diagnosis not present

## 2019-08-30 DIAGNOSIS — M1990 Unspecified osteoarthritis, unspecified site: Secondary | ICD-10-CM | POA: Diagnosis not present

## 2019-08-30 DIAGNOSIS — D649 Anemia, unspecified: Secondary | ICD-10-CM

## 2019-08-30 DIAGNOSIS — K219 Gastro-esophageal reflux disease without esophagitis: Secondary | ICD-10-CM | POA: Diagnosis not present

## 2019-08-30 DIAGNOSIS — K449 Diaphragmatic hernia without obstruction or gangrene: Secondary | ICD-10-CM | POA: Insufficient documentation

## 2019-08-30 DIAGNOSIS — R140 Abdominal distension (gaseous): Secondary | ICD-10-CM | POA: Diagnosis not present

## 2019-08-30 DIAGNOSIS — E7800 Pure hypercholesterolemia, unspecified: Secondary | ICD-10-CM | POA: Diagnosis not present

## 2019-08-30 HISTORY — PX: COLONOSCOPY WITH PROPOFOL: SHX5780

## 2019-08-30 HISTORY — DX: Unspecified atrial fibrillation: I48.91

## 2019-08-30 HISTORY — PX: ESOPHAGOGASTRODUODENOSCOPY (EGD) WITH PROPOFOL: SHX5813

## 2019-08-30 LAB — GLUCOSE, CAPILLARY: Glucose-Capillary: 81 mg/dL (ref 70–99)

## 2019-08-30 SURGERY — COLONOSCOPY WITH PROPOFOL
Anesthesia: General

## 2019-08-30 MED ORDER — LIDOCAINE HCL (CARDIAC) PF 100 MG/5ML IV SOSY
PREFILLED_SYRINGE | INTRAVENOUS | Status: DC | PRN
  Administered 2019-08-30: 50 mg via INTRAVENOUS

## 2019-08-30 MED ORDER — PROPOFOL 10 MG/ML IV BOLUS
INTRAVENOUS | Status: DC | PRN
  Administered 2019-08-30: 50 mg via INTRAVENOUS

## 2019-08-30 MED ORDER — LIDOCAINE HCL (PF) 2 % IJ SOLN
INTRAMUSCULAR | Status: AC
  Filled 2019-08-30: qty 5

## 2019-08-30 MED ORDER — PROPOFOL 500 MG/50ML IV EMUL
INTRAVENOUS | Status: DC | PRN
  Administered 2019-08-30: 120 ug/kg/min via INTRAVENOUS

## 2019-08-30 MED ORDER — SODIUM CHLORIDE 0.9 % IV SOLN
INTRAVENOUS | Status: DC
  Administered 2019-08-30: 1000 mL via INTRAVENOUS

## 2019-08-30 MED ORDER — PROPOFOL 500 MG/50ML IV EMUL
INTRAVENOUS | Status: AC
  Filled 2019-08-30: qty 50

## 2019-08-30 NOTE — Anesthesia Preprocedure Evaluation (Signed)
Anesthesia Evaluation  Patient identified by MRN, date of birth, ID band Patient awake    Reviewed: Allergy & Precautions, H&P , NPO status , Patient's Chart, lab work & pertinent test results  History of Anesthesia Complications Negative for: history of anesthetic complications  Airway Mallampati: III  TM Distance: <3 FB Neck ROM: limited    Dental  (+) Chipped   Pulmonary neg pulmonary ROS, neg shortness of breath,    Pulmonary exam normal        Cardiovascular Exercise Tolerance: Good (-) angina(-) Past MI and (-) DOE Normal cardiovascular exam+ dysrhythmias Atrial Fibrillation      Neuro/Psych negative neurological ROS  negative psych ROS   GI/Hepatic Neg liver ROS, GERD  Medicated and Controlled,  Endo/Other  negative endocrine ROS  Renal/GU negative Renal ROS  negative genitourinary   Musculoskeletal  (+) Arthritis ,   Abdominal   Peds  Hematology negative hematology ROS (+)   Anesthesia Other Findings Past Medical History: No date: AF (atrial fibrillation) ([facility]) No date: Cancer ([place]) No date: Dysrhythmia     Comment:  A FIB No date: GERD (gastroesophageal reflux disease) No date: HOH (hard of hearing) No date: PAF (paroxysmal atrial fibrillation) ([facility]) No date: Palpitations No date: Pulmonary nodules  Past Surgical History: No date: ABDOMINAL HYSTERECTOMY 01/13/2018: CATARACT EXTRACTION W/PHACO; Right     Comment:  Procedure: CATARACT EXTRACTION PHACO AND INTRAOCULAR               LENS PLACEMENT ([facility]);  Surgeon: Birder Robson, MD;                Location: [facility] ORS;  Service: Ophthalmology;  Laterality:              Right;  [place]  01:01 CDE 8.84 Fluid pack lot # [id number] 02/17/2018: CATARACT EXTRACTION W/PHACO; Left     Comment:  Procedure: CATARACT EXTRACTION PHACO AND INTRAOCULAR               LENS PLACEMENT ([place]) LEFT;  Surgeon: Birder Robson,               MD;  Location: [facility] ORS;   Service: Ophthalmology;                Laterality: Left;  [place] 00:36.5 CDE 5.01 Fluid pack lot #              [id number] 07/11/2017: ESOPHAGOGASTRODUODENOSCOPY (EGD) WITH PROPOFOL; N/A     Comment:  Procedure: ESOPHAGOGASTRODUODENOSCOPY (EGD) WITH               PROPOFOL;  Surgeon: Lollie Sails, MD;  Location:               [facility] ENDOSCOPY;  Service: Endoscopy;  Laterality: N/A;     Reproductive/Obstetrics negative OB ROS                             Anesthesia Physical Anesthesia Plan  ASA: III  Anesthesia Plan: General   Post-op Pain Management:    Induction: Intravenous  PONV Risk Score and Plan: Propofol infusion and TIVA  Airway Management Planned: Natural Airway and Nasal Cannula  Additional Equipment:   Intra-op Plan:   Post-operative Plan:   Informed Consent: I have reviewed the patients History and Physical, chart, labs and discussed the procedure including the risks, benefits and alternatives for the proposed anesthesia with the patient or authorized representative who has indicated his/her understanding  and acceptance.     Dental Advisory Given  Plan Discussed with: Anesthesiologist, CRNA and Surgeon  Anesthesia Plan Comments: (Patient consented for risks of anesthesia including but not limited to:  - adverse reactions to medications - risk of intubation if required - damage to eyes, teeth, lips or other oral mucosa - nerve damage due to positioning  - sore throat or hoarseness - Damage to heart, brain, nerves, lungs, other parts of body or loss of life  Patient voiced understanding.)        Anesthesia Quick Evaluation

## 2019-08-30 NOTE — Transfer of Care (Signed)
Immediate Anesthesia Transfer of Care Note  Patient: Carrie Rodriguez  Procedure(s) Performed: COLONOSCOPY WITH PROPOFOL (N/A ) ESOPHAGOGASTRODUODENOSCOPY (EGD) WITH PROPOFOL (N/A )  Patient Location: Endoscopy Unit  Anesthesia Type:General  Level of Consciousness: drowsy and patient cooperative  Airway & Oxygen Therapy: Patient Spontanous Breathing  Post-op Assessment: Report given to RN and Post -op Vital signs reviewed and stable  Post vital signs: Reviewed and stable  Last Vitals:  Vitals Value Taken Time  BP 141/63 08/30/19 1428  Temp 35.6 C 08/30/19 1428  Pulse 76 08/30/19 1433  Resp 18 08/30/19 1433  SpO2 100 % 08/30/19 1433  Vitals shown include unvalidated device data.  Last Pain:  Vitals:   08/30/19 1428  TempSrc: Temporal      Patients Stated Pain Goal: 0 (11/94/17 4081)  Complications: No complications documented.

## 2019-08-30 NOTE — Op Note (Signed)
[facility] Gastroenterology Patient Name: Carrie Rodriguez Procedure Date: 08/30/2019 1:40 PM MRN: [id number] Account #: [id number] Date of Birth: 08-28-1933 Admit Type: Outpatient Age: 86 Room: [facility] ENDO ROOM 1 Gender: Female Note Status: Finalized Procedure:             Colonoscopy Indications:           Abdominal pain in the right lower quadrant Providers:             Lin Landsman MD, MD Referring MD:          [name]. [name], MD (Referring MD) Medicines:             Monitored Anesthesia Care Complications:         No immediate complications. Estimated blood loss: None. Procedure:             Pre-Anesthesia Assessment:                        - Prior to the procedure, a History and Physical was                         performed, and patient medications and allergies were                         reviewed. The patient is competent. The risks and                         benefits of the procedure and the sedation options and                         risks were discussed with the patient. All questions                         were answered and informed consent was obtained.                         Patient identification and proposed procedure were                         verified by the physician, the nurse, the                         anesthesiologist, the anesthetist and the technician                         in the pre-procedure area in the procedure room in the                         endoscopy suite. Mental Status Examination: alert and                         oriented. Airway Examination: normal oropharyngeal                         airway and neck mobility. Respiratory Examination:                         clear to auscultation. CV Examination: normal.  Prophylactic Antibiotics: The patient does not require                         prophylactic antibiotics. Prior Anticoagulants: The                         patient has taken no previous  anticoagulant or                         antiplatelet agents except for aspirin. ASA Grade                         Assessment: III - A patient with severe systemic                         disease. After reviewing the risks and benefits, the                         patient was deemed in satisfactory condition to                         undergo the procedure. The anesthesia plan was to use                         monitored anesthesia care (MAC). Immediately prior to                         administration of medications, the patient was                         re-assessed for adequacy to receive sedatives. The                         heart rate, respiratory rate, oxygen saturations,                         blood pressure, adequacy of pulmonary ventilation, and                         response to care were monitored throughout the                         procedure. The physical status of the patient was                         re-assessed after the procedure.                        After obtaining informed consent, the colonoscope was                         passed under direct vision. Throughout the procedure,                         the patient's blood pressure, pulse, and oxygen                         saturations were monitored continuously. The  Colonoscope was introduced through the anus and                         advanced to the the terminal ileum, with                         identification of the appendiceal orifice and IC                         valve. The colonoscopy was unusually difficult due to                         multiple diverticula in the colon and significant                         looping. Successful completion of the procedure was                         aided by applying abdominal pressure. The patient                         tolerated the procedure well. The quality of the bowel                         preparation was evaluated using the BBPS  [facility] Bowel                         Preparation Scale) with scores of: Right Colon = 3,                         Transverse Colon = 3 and Left Colon = 3 (entire mucosa                         seen well with no residual staining, small fragments                         of stool or opaque liquid). The total BBPS score                         equals 9. Findings:      The perianal and digital rectal examinations were normal. Pertinent       negatives include normal sphincter tone and no palpable rectal lesions.      The terminal ileum contained a single (solitary) three mm ulcer. No       bleeding was present. No stigmata of recent bleeding were seen. Biopsies       were taken with a cold forceps for histology.      The entire examined colon appeared normal.      Multiple diverticula were found in the sigmoid colon.      The retroflexed view of the distal rectum and anal verge was normal and       showed no anal or rectal abnormalities. Impression:            - A single (solitary) ulcer in the terminal ileum.                         Biopsied.                        -  The entire examined colon is normal.                        - Diverticulosis in the sigmoid colon.                        - The distal rectum and anal verge are normal on                         retroflexion view. Recommendation:        - Discharge patient to home (with escort).                        - Resume previous diet today.                        - Continue present medications.                        - Await pathology results.                        - Return to my office as previously scheduled. Procedure Code(s):     --- Professional ---                        [phone], Colonoscopy, flexible; with biopsy, single or                         multiple Diagnosis Code(s):     --- Professional ---                        K63.3, Ulcer of intestine                        R10.31, Right lower quadrant pain                         K57.30, Diverticulosis of large intestine without                         perforation or abscess without bleeding CPT copyright 2019 American Medical Association. All rights reserved. The codes documented in this report are preliminary and upon coder review may  be revised to meet current compliance requirements. Dr. Ulyess Mort Lin Landsman MD, MD 08/30/2019 2:24:24 PM This report has been signed electronically. Number of Addenda: 0 Note Initiated On: 08/30/2019 1:40 PM Scope Withdrawal Time: 0 hours 13 minutes 18 seconds  Total Procedure Duration: 0 hours 19 minutes 20 seconds  Estimated Blood Loss:  Estimated blood loss: none.      [facility]

## 2019-08-30 NOTE — Op Note (Addendum)
[facility] Gastroenterology Patient Name: Carrie Rodriguez Procedure Date: 08/30/2019 1:41 PM MRN: [id number] Account #: [id number] Date of Birth: 1933-11-23 Admit Type: Outpatient Age: 86 Room: [facility] ENDO ROOM 1 Gender: Female Note Status: Finalized Procedure:             Upper GI endoscopy Indications:           Follow-up of gastro-esophageal reflux disease Providers:             Lin Landsman MD, MD Referring MD:          [name]. [name], MD (Referring MD) Medicines:             Monitored Anesthesia Care Complications:         No immediate complications. Estimated blood loss: None. Procedure:             Pre-Anesthesia Assessment:                        - Prior to the procedure, a History and Physical was                         performed, and patient medications and allergies were                         reviewed. The patient is competent. The risks and                         benefits of the procedure and the sedation options and                         risks were discussed with the patient. All questions                         were answered and informed consent was obtained.                         Patient identification and proposed procedure were                         verified by the physician, the nurse, the                         anesthesiologist, the anesthetist and the technician                         in the pre-procedure area in the procedure room in the                         endoscopy suite. Mental Status Examination: alert and                         oriented. Airway Examination: normal oropharyngeal                         airway and neck mobility. Respiratory Examination:                         clear to auscultation. CV Examination: normal.  Prophylactic Antibiotics: The patient does not require                         prophylactic antibiotics. Prior Anticoagulants: The                         patient has taken  no previous anticoagulant or                         antiplatelet agents. ASA Grade Assessment: III - A                         patient with severe systemic disease. After reviewing                         the risks and benefits, the patient was deemed in                         satisfactory condition to undergo the procedure. The                         anesthesia plan was to use monitored anesthesia care                         (MAC). Immediately prior to administration of                         medications, the patient was re-assessed for adequacy                         to receive sedatives. The heart rate, respiratory                         rate, oxygen saturations, blood pressure, adequacy of                         pulmonary ventilation, and response to care were                         monitored throughout the procedure. The physical                         status of the patient was re-assessed after the                         procedure.                        After obtaining informed consent, the endoscope was                         passed under direct vision. Throughout the procedure,                         the patient's blood pressure, pulse, and oxygen                         saturations were monitored continuously. The Endoscope  was introduced through the mouth, and advanced to the                         second part of duodenum. The upper GI endoscopy was                         accomplished without difficulty. The patient tolerated                         the procedure well. Findings:      The duodenal bulb and second portion of the duodenum were normal.      A small hiatal hernia was present.      The stomach was normal.      The cardia and gastric fundus were normal on retroflexion.      The gastroesophageal junction and examined esophagus were normal. Impression:            - Normal duodenal bulb and second portion of the                          duodenum.                        - Small hiatal hernia.                        - Normal stomach.                        - Normal gastroesophageal junction and esophagus.                        - No specimens collected. Recommendation:        - Follow an antireflux regimen.                        - Continue present medications.                        - Proceed with colonoscopy as scheduled                        See colonoscopy report Procedure Code(s):     --- Professional ---                        [phone], Esophagogastroduodenoscopy, flexible,                         transoral; diagnostic, including collection of                         specimen(s) by brushing or washing, when performed                         (separate procedure) Diagnosis Code(s):     --- Professional ---                        K44.9, Diaphragmatic hernia without obstruction or                         gangrene  K21.9, Gastro-esophageal reflux disease without                         esophagitis CPT copyright 2019 American Medical Association. All rights reserved. The codes documented in this report are preliminary and upon coder review may  be revised to meet current compliance requirements. Dr. Ulyess Mort Lin Landsman MD, MD 08/30/2019 1:59:54 PM This report has been signed electronically. Number of Addenda: 0 Note Initiated On: 08/30/2019 1:41 PM Estimated Blood Loss:  Estimated blood loss: none.      [facility]

## 2019-08-30 NOTE — H&P (Signed)
Carrie Darby, MD [address]  [place]  [place], [place] [address]  Main: [phone]  Fax: [phone] Pager: [phone]  Primary Care Physician:  Idelle Crouch, MD Primary Gastroenterologist:  Dr. Cephas Rodriguez  Pre-Procedure History & Physical: HPI:  Carrie Rodriguez is a 86 y.o. female is here for an endoscopy and colonoscopy.   Past Medical History:  Diagnosis Date  . AF (atrial fibrillation) ([place])   . Cancer ([place])   . Dysrhythmia    A FIB  . GERD (gastroesophageal reflux disease)   . HOH (hard of hearing)   . PAF (paroxysmal atrial fibrillation) ([place])   . Palpitations   . Pulmonary nodules     Past Surgical History:  Procedure Laterality Date  . ABDOMINAL HYSTERECTOMY    . CATARACT EXTRACTION W/PHACO Right 01/13/2018   Procedure: CATARACT EXTRACTION PHACO AND INTRAOCULAR LENS PLACEMENT ([place]);  Surgeon: Birder Robson, MD;  Location: [facility] ORS;  Service: Ophthalmology;  Laterality: Right;  [place]  01:01 CDE 8.84 Fluid pack lot # [id number]  . CATARACT EXTRACTION W/PHACO Left 02/17/2018   Procedure: CATARACT EXTRACTION PHACO AND INTRAOCULAR LENS PLACEMENT ([place]) LEFT;  Surgeon: Birder Robson, MD;  Location: [facility] ORS;  Service: Ophthalmology;  Laterality: Left;  [place] 00:36.5 CDE 5.01 Fluid pack lot # [id number]  . ESOPHAGOGASTRODUODENOSCOPY (EGD) WITH PROPOFOL N/A 07/11/2017   Procedure: ESOPHAGOGASTRODUODENOSCOPY (EGD) WITH PROPOFOL;  Surgeon: Lollie Sails, MD;  Location: [facility] ENDOSCOPY;  Service: Endoscopy;  Laterality: N/A;    Prior to Admission medications   Medication Sig Start Date End Date Taking? Authorizing Provider  acetaminophen (TYLENOL) 500 MG tablet Take 1,000 mg by mouth daily as needed (arthritis pain).    [provider]  aspirin EC 81 MG tablet Take 81 mg by mouth daily at 2 PM.     [provider]  Biotin 5 MG CAPS Take 5 mg by mouth daily.    [provider]  Ergocalciferol (VITAMIN D2) 10 MCG (400 UNIT)  TABS Take 400 Units by mouth daily.    [provider]  folic acid (FOLVITE) 440 MCG tablet Take 400 mcg by mouth daily.    [provider]  Magnesium 250 MG TABS Take 250 mg by mouth daily.    [provider]  Multiple Vitamin (MULTIVITAMIN WITH MINERALS) TABS tablet Take 1 tablet by mouth daily.    [provider]  propranolol (INDERAL) 10 MG tablet Take by mouth. 06/14/19 06/13/20  [provider]    Allergies as of 07/27/2019  . (No Known Allergies)    Family History  Problem Relation Age of Onset  . Breast cancer Neg Hx     Social History   Socioeconomic History  . Marital status: Widowed    Spouse name: Not on file  . Number of children: Not on file  . Years of education: Not on file  . Highest education level: Not on file  Occupational History  . Not on file  Tobacco Use  . Smoking status: Never Smoker  . Smokeless tobacco: Never Used  Vaping Use  . Vaping Use: Never used  Substance and Sexual Activity  . Alcohol use: Not Currently    Comment: 1 glass of wine a year   . Drug use: Not Currently  . Sexual activity: Yes    Birth control/protection: Surgical  Other Topics Concern  . Not on file  Social History Narrative  . Not on file   Social Determinants of Health  Financial Resource Strain:   . Difficulty of Paying Living Expenses:   Food Insecurity:   . Worried About Charity fundraiser in the Last Year:   . Arboriculturist in the Last Year:   Transportation Needs:   . Film/video editor (Medical):   Marland Kitchen Lack of Transportation (Non-Medical):   Physical Activity:   . Days of Exercise per Week:   . Minutes of Exercise per Session:   Stress:   . Feeling of Stress :   Social Connections:   . Frequency of Communication with Friends and Family:   . Frequency of Social Gatherings with Friends and Family:   . Attends Religious Services:   . Active Member of Clubs or Organizations:   . Attends Archivist  Meetings:   Marland Kitchen Marital Status:   Intimate Partner Violence:   . Fear of Current or Ex-Partner:   . Emotionally Abused:   Marland Kitchen Physically Abused:   . Sexually Abused:     Review of Systems: See HPI, otherwise negative ROS  Physical Exam: BP (!) 169/75   Temp (!) 96.5 F (35.8 C) (Temporal)   Resp 20   Ht 5\' 4"  (1.626 m)   Wt 55.3 kg   SpO2 100%   BMI 20.94 kg/m  General:   Alert,  pleasant and cooperative in NAD Head:  Normocephalic and atraumatic. Neck:  Supple; no masses or thyromegaly. Lungs:  Clear throughout to auscultation.    Heart:  Regular rate and rhythm. Abdomen:  Soft, nontender and nondistended. Normal bowel sounds, without guarding, and without rebound.   Neurologic:  Alert and  oriented x4;  grossly normal neurologically.  Impression/Plan: Carrie Rodriguez is here for an endoscopy and colonoscopy to be performed for chronic GERD, RLQ pain with bloating  Risks, benefits, limitations, and alternatives regarding  endoscopy and colonoscopy have been reviewed with the patient.  Questions have been answered.  All parties agreeable.   Sherri Sear, MD  08/30/2019, 1:27 PM

## 2019-08-31 ENCOUNTER — Encounter: Payer: Self-pay | Admitting: Gastroenterology

## 2019-09-01 ENCOUNTER — Telehealth: Payer: Self-pay

## 2019-09-01 LAB — SURGICAL PATHOLOGY

## 2019-09-01 NOTE — Telephone Encounter (Signed)
-----   Message from Lin Landsman, MD sent at 09/01/2019  1:22 PM EDT ----- Please inform patient that the pathology results from recent colonoscopy shows mild inflammation in her terminal ileum. Avoid frequent use of nonsteroidal anti-inflammatory medications and discontinue baby aspirin for 1 month to see if the right lower quadrant pain improves  Follow-up in 2 monthsRohini Rodriguez

## 2019-09-01 NOTE — Telephone Encounter (Signed)
Patient verbalized understanding of results. Patient states she will just take tylenol for pain and will stop the baby aspirin. She made follow up appointment for October

## 2019-09-02 NOTE — Anesthesia Postprocedure Evaluation (Signed)
Anesthesia Post Note  Patient: Carrie Rodriguez  Procedure(s) Performed: COLONOSCOPY WITH PROPOFOL (N/A ) ESOPHAGOGASTRODUODENOSCOPY (EGD) WITH PROPOFOL (N/A )  Patient location during evaluation: Endoscopy Anesthesia Type: General Level of consciousness: awake and alert Pain management: pain level controlled Vital Signs Assessment: post-procedure vital signs reviewed and stable Respiratory status: spontaneous breathing, nonlabored ventilation and respiratory function stable Cardiovascular status: blood pressure returned to baseline and stable Postop Assessment: no apparent nausea or vomiting Anesthetic complications: no   No complications documented.   Last Vitals:  Vitals:   08/30/19 1448 08/30/19 1458  BP: (!) 148/75 (!) 159/69  Pulse: 80 81  Resp: 16 18  Temp:    SpO2: 99% 98%    Last Pain:  Vitals:   08/31/19 0750  TempSrc:   PainSc: 0-No pain                 Alphonsus Sias

## 2019-09-15 DIAGNOSIS — D508 Other iron deficiency anemias: Secondary | ICD-10-CM | POA: Diagnosis not present

## 2019-09-15 DIAGNOSIS — Z79899 Other long term (current) drug therapy: Secondary | ICD-10-CM | POA: Diagnosis not present

## 2019-09-20 NOTE — Progress Notes (Signed)
09/21/2019 9:42 AM   Carrie Rodriguez 25-May-1933 [id number]  Referring provider: Idelle Crouch, MD [facility] [place],   [address] Chief Complaint  Patient presents with   Hematuria    HPI: Carrie Rodriguez is a 86 y.o. female who presents today to be evaluated for hematuria.   The patient has a history of microscopic hematuria dating back to at least 2019. She has had no previous urology work up.   CT A/P w/ constrast on 03/22/2019 showed no acute intra-abdominal or pelvic pathology. Colonic diverticulosis. No bowel obstruction or active inflammation. Innumerable hepatic cysts and subcentimeter hypodense lesions which were too small to characterize. NO GU pathology.   She last saw her PCP on 09/15/2019 for follow up. She had ongoing abdominal pain. She was constipated. UA showed moderate blood and 4-10 RBC. There was no associating culture.   Patient recently had a terminal ileum ulcer biopsy on 08/30/2019. Pathology showed small fragments of small intestinal mucosa with very focal mils active enteritis. Negative for viral cytopathic effect, evidence of chronicity, dysplasia, and malignancy.   The patient reports urinary frequency with associated straining. No hematuria.   She has upper epigastric area pain which she attributes to her ulcer.  Never smoker.  PMH: Past Medical History:  Diagnosis Date   AF (atrial fibrillation) ([facility])    Cancer ([facility])    Dysrhythmia    A FIB   GERD (gastroesophageal reflux disease)    HOH (hard of hearing)    PAF (paroxysmal atrial fibrillation) ([facility])    Palpitations    Pulmonary nodules     Surgical History: Past Surgical History:  Procedure Laterality Date   ABDOMINAL HYSTERECTOMY     CATARACT EXTRACTION W/PHACO Right 01/13/2018   Procedure: CATARACT EXTRACTION PHACO AND INTRAOCULAR LENS PLACEMENT ([place]);  Surgeon: Birder Robson, MD;  Location: [facility] ORS;  Service: Ophthalmology;   Laterality: Right;  [place]  01:01 CDE 8.84 Fluid pack lot # [id number]   CATARACT EXTRACTION W/PHACO Left 02/17/2018   Procedure: CATARACT EXTRACTION PHACO AND INTRAOCULAR LENS PLACEMENT ([place]) LEFT;  Surgeon: Birder Robson, MD;  Location: [facility] ORS;  Service: Ophthalmology;  Laterality: Left;  [place] 00:36.5 CDE 5.01 Fluid pack lot # [id number]   COLONOSCOPY WITH PROPOFOL N/A 08/30/2019   Procedure: COLONOSCOPY WITH PROPOFOL;  Surgeon: Lin Landsman, MD;  Location: [facility] ENDOSCOPY;  Service: Gastroenterology;  Laterality: N/A;   ESOPHAGOGASTRODUODENOSCOPY (EGD) WITH PROPOFOL N/A 07/11/2017   Procedure: ESOPHAGOGASTRODUODENOSCOPY (EGD) WITH PROPOFOL;  Surgeon: Lollie Sails, MD;  Location: [facility] ENDOSCOPY;  Service: Endoscopy;  Laterality: N/A;   ESOPHAGOGASTRODUODENOSCOPY (EGD) WITH PROPOFOL N/A 08/30/2019   Procedure: ESOPHAGOGASTRODUODENOSCOPY (EGD) WITH PROPOFOL;  Surgeon: Lin Landsman, MD;  Location: [facility] ENDOSCOPY;  Service: Gastroenterology;  Laterality: N/A;    Home Medications:  Allergies as of 09/21/2019   No Known Allergies     Medication List       Accurate as of September 21, 2019  9:42 AM. If you have any questions, ask your nurse or doctor.        acetaminophen 500 MG tablet Commonly known as: TYLENOL Take 1,000 mg by mouth daily as needed (arthritis pain).   aspirin EC 81 MG tablet Take 81 mg by mouth daily at 2 PM.   Biotin 5 MG Caps Take 5 mg by mouth daily.   folic acid 706 MCG tablet Commonly known as: FOLVITE Take 400 mcg by mouth daily.   Magnesium 250 MG Tabs Take 250  mg by mouth daily.   multivitamin with minerals Tabs tablet Take 1 tablet by mouth daily.   propranolol 10 MG tablet Commonly known as: INDERAL Take by mouth.   RABEprazole 20 MG tablet Commonly known as: ACIPHEX Take 20 mg by mouth 2 (two) times daily.   Vitamin D2 10 MCG (400 UNIT) Tabs Take 400 Units by mouth daily.       Allergies: No Known Allergies  Family  History: Family History  Problem Relation Age of Onset   Breast cancer Neg Hx     Social History:  reports that she has never smoked. She has never used smokeless tobacco. She reports previous alcohol use. She reports previous drug use.   Physical Exam: BP (!) 164/81    Pulse 93    Ht 5\' 7"  (1.702 m)    Wt 122 lb (55.3 kg)    BMI 19.11 kg/m   Constitutional:  Alert and oriented, No acute distress. HEENT: [place] AT, moist mucus membranes.  Trachea midline, no masses. Cardiovascular: No clubbing, cyanosis, or edema. Respiratory: Normal respiratory effort, no increased work of breathing. Skin: No rashes, bruises or suspicious lesions. Neurologic: Grossly intact, no focal deficits, moving all 4 extremities. Psychiatric: Normal mood and affect.  Laboratory Data:  Urinalysis Shows no microscopic blood  Pertinent Imaging:  CLINICAL DATA:  86 year old female with abdominal pain.  EXAM: CT ABDOMEN AND PELVIS WITH CONTRAST  TECHNIQUE: Multidetector CT imaging of the abdomen and pelvis was performed using the standard protocol following bolus administration of intravenous contrast.  CONTRAST:  174mL OMNIPAQUE IOHEXOL 300 MG/ML  SOLN  COMPARISON:  Abdominal ultrasound dated 07/31/2010.  FINDINGS: Lower chest: The visualized lung bases are clear.  No intra-abdominal free air or free fluid.  Hepatobiliary: There are innumerable (greater than 20) liver cysts and additional smaller hypodensities which are not characterized on this CT, possibly cyst or hemangioma. The largest cysts measure up to approximately 16 mm. There is no intrahepatic biliary ductal dilatation. The gallbladder is unremarkable.  Pancreas: Unremarkable. No pancreatic ductal dilatation or surrounding inflammatory changes.  Spleen: Normal in size without focal abnormality.  Adrenals/Urinary Tract: The adrenal glands are unremarkable. There is no hydronephrosis on either side. There is symmetric  enhancement and excretion of contrast by both kidneys. The visualized ureters and urinary bladder appear unremarkable.  Stomach/Bowel: There is sigmoid diverticulosis without active inflammatory changes. There is large amount of stool throughout the colon. There is no bowel obstruction or active inflammation. The appendix is not visualized with certainty. No inflammatory changes identified in the right lower quadrant.  Vascular/Lymphatic: Moderate aortoiliac atherosclerotic disease. The IVC is unremarkable. No portal venous gas. There is no adenopathy.  Reproductive: Hysterectomy.  No pelvic mass.  Other: None  Musculoskeletal: Osteopenia with degenerative changes of the lower lumbar spine and scoliosis. No acute osseous pathology.  IMPRESSION: 1. No acute intra-abdominal or pelvic pathology. 2. Colonic diverticulosis. No bowel obstruction or active inflammation. 3. Innumerable hepatic cysts and subcentimeter hypodense lesions which are too small to characterize. MRI may provide better characterization if clinically indicated. 4.  Aortic Atherosclerosis (ICD10-I70.0).   Electronically Signed   By: Anner Crete M.D.   On: 03/22/2019 20:06   I have personally reviewed the images and agree with radiologist interpretation.    Assessment & Plan:    1. Microscopic hematuria Microscopic hematuria multiple occasions, chronic, asymptomatic We discussed the AUA guidelines for microscopic hematuria evaluation which she has never previously had Technically based on her age alone, she  also is a high risk category but she is a never smoker and is relatively low number of red blood cells, none today Given that she is recently had an upper tract CT with contrast 6 months ago, plan to hold off on upper tract imaging for now She understands the possibility of missed diagnosis of upper tract urothelial cancer but she has minimal risk factors and the prevalence is very  low Recommended a cystoscopy and cytology to rule out underlying bladder pathology Patient agreed.  No follow-ups on file.  [facility] [address] [place] [address] [place], [place] [address] [phone]  I, Selena Batten, am acting as a scribe for Dr. Hollice Espy.  I have reviewed the above documentation for accuracy and completeness, and I agree with the above.   Hollice Espy, MD

## 2019-09-21 ENCOUNTER — Encounter: Payer: Self-pay | Admitting: Urology

## 2019-09-21 ENCOUNTER — Ambulatory Visit: Payer: Medicare HMO | Admitting: Urology

## 2019-09-21 ENCOUNTER — Other Ambulatory Visit: Payer: Self-pay

## 2019-09-21 VITALS — BP 164/81 | HR 93 | Ht 67.0 in | Wt 122.0 lb

## 2019-09-21 DIAGNOSIS — R319 Hematuria, unspecified: Secondary | ICD-10-CM

## 2019-09-21 LAB — URINALYSIS, COMPLETE
Bilirubin, UA: NEGATIVE
Glucose, UA: NEGATIVE
Ketones, UA: NEGATIVE
Leukocytes,UA: NEGATIVE
Nitrite, UA: NEGATIVE
Specific Gravity, UA: 1.015 (ref 1.005–1.030)
Urobilinogen, Ur: 0.2 mg/dL (ref 0.2–1.0)
pH, UA: 5.5 (ref 5.0–7.5)

## 2019-09-21 LAB — MICROSCOPIC EXAMINATION: Bacteria, UA: NONE SEEN

## 2019-09-21 NOTE — Patient Instructions (Signed)
Cystoscopy Cystoscopy is a procedure that is used to help diagnose and sometimes treat conditions that affect the lower urinary tract. The lower urinary tract includes the bladder and the urethra. The urethra is the tube that drains urine from the bladder. Cystoscopy is done using a thin, tube-shaped instrument with a light and camera at the end (cystoscope). The cystoscope may be hard or flexible, depending on the goal of the procedure. The cystoscope is inserted through the urethra, into the bladder. Cystoscopy may be recommended if you have:  Urinary tract infections that keep coming back.  Blood in the urine (hematuria).  An inability to control when you urinate (urinary incontinence) or an overactive bladder.  Unusual cells found in a urine sample.  A blockage in the urethra, such as a urinary stone.  Painful urination.  An abnormality in the bladder found during an intravenous pyelogram (IVP) or CT scan. Cystoscopy may also be done to remove a sample of tissue to be examined under a microscope (biopsy). What are the risks? Generally, this is a safe procedure. However, problems may occur, including:  Infection.  Bleeding.  What happens during the procedure?  1. You will be given one or more of the following: ? A medicine to numb the area (local anesthetic). 2. The area around the opening of your urethra will be cleaned. 3. The cystoscope will be passed through your urethra into your bladder. 4. Germ-free (sterile) fluid will flow through the cystoscope to fill your bladder. The fluid will stretch your bladder so that your health care provider can clearly examine your bladder walls. 5. Your doctor will look at the urethra and bladder. 6. The cystoscope will be removed The procedure may vary among health care providers  What can I expect after the procedure? After the procedure, it is common to have: 1. Some soreness or pain in your abdomen and urethra. 2. Urinary symptoms.  These include: ? Mild pain or burning when you urinate. Pain should stop within a few minutes after you urinate. This may last for up to 1 week. ? A small amount of blood in your urine for several days. ? Feeling like you need to urinate but producing only a small amount of urine. Follow these instructions at home: General instructions  Return to your normal activities as told by your health care provider.   Do not drive for 24 hours if you were given a sedative during your procedure.  Watch for any blood in your urine. If the amount of blood in your urine increases, call your health care provider.  If a tissue sample was removed for testing (biopsy) during your procedure, it is up to you to get your test results. Ask your health care provider, or the department that is doing the test, when your results will be ready.  Drink enough fluid to keep your urine pale yellow.  Keep all follow-up visits as told by your health care provider. This is important. Contact a health care provider if you:  Have pain that gets worse or does not get better with medicine, especially pain when you urinate.  Have trouble urinating.  Have more blood in your urine. Get help right away if you:  Have blood clots in your urine.  Have abdominal pain.  Have a fever or chills.  Are unable to urinate. Summary  Cystoscopy is a procedure that is used to help diagnose and sometimes treat conditions that affect the lower urinary tract.  Cystoscopy is done using   a thin, tube-shaped instrument with a light and camera at the end.  After the procedure, it is common to have some soreness or pain in your abdomen and urethra.  Watch for any blood in your urine. If the amount of blood in your urine increases, call your health care provider.  If you were prescribed an antibiotic medicine, take it as told by your health care provider. Do not stop taking the antibiotic even if you start to feel better. This  information is not intended to replace advice given to you by your health care provider. Make sure you discuss any questions you have with your health care provider. Document Revised: 01/20/2018 Document Reviewed: 01/20/2018 Elsevier Patient Education  2020 [employer]vier Inc[employer]   

## 2019-10-12 ENCOUNTER — Other Ambulatory Visit: Payer: Self-pay | Admitting: Urology

## 2019-10-12 ENCOUNTER — Other Ambulatory Visit: Payer: Self-pay

## 2019-10-12 ENCOUNTER — Ambulatory Visit: Payer: Medicare HMO | Admitting: Urology

## 2019-10-12 ENCOUNTER — Encounter: Payer: Self-pay | Admitting: Urology

## 2019-10-12 VITALS — BP 178/63 | HR 83 | Ht 64.0 in | Wt 122.0 lb

## 2019-10-12 DIAGNOSIS — R3129 Other microscopic hematuria: Secondary | ICD-10-CM | POA: Diagnosis not present

## 2019-10-12 LAB — URINALYSIS, COMPLETE
Bilirubin, UA: NEGATIVE
Glucose, UA: NEGATIVE
Ketones, UA: NEGATIVE
Leukocytes,UA: NEGATIVE
Nitrite, UA: NEGATIVE
Protein,UA: NEGATIVE
Specific Gravity, UA: 1.01 (ref 1.005–1.030)
Urobilinogen, Ur: 0.2 mg/dL (ref 0.2–1.0)
pH, UA: 5.5 (ref 5.0–7.5)

## 2019-10-12 LAB — MICROSCOPIC EXAMINATION
Bacteria, UA: NONE SEEN
Epithelial Cells (non renal): NONE SEEN /hpf (ref 0–10)

## 2019-10-12 NOTE — Progress Notes (Signed)
° °  10/12/19  CC:  Chief Complaint  Patient presents with   Cysto    HPI: 86 year old female with microscopic hematuria who presents today for cystoscopic evaluation.  Please he previous notes for details.  She had a CT abdomen pelvis with contrast 6 months ago without delayed imaging.  Discussed the risk and benefits of pursuing delayed upper tract imaging, ultimately elected not to pursue.  Vitals:   10/12/19 0900  BP: (!) 178/63  Pulse: 83   NED. A&Ox3.   No respiratory distress   Abd soft, NT, ND Normal external genitalia with patent urethral meatus  Cystoscopy Procedure Note  Patient identification was confirmed, informed consent was obtained, and patient was prepped using Betadine solution.  Lidocaine jelly was administered per urethral meatus.    Procedure: - Flexible cystoscope introduced, without any difficulty.   - Thorough search of the bladder revealed:    normal urethral meatus    normal urothelium    no stones    no ulcers     no tumors    no urethral polyps    no trabeculation  - Ureteral orifices were normal in position and appearance.  Post-Procedure: - Patient tolerated the procedure well  Assessment/ Plan:  1. Microscopic hematuria Cystoscopy today is unremarkable Please see previous notes for discussion of deferring upper tract imaging Urine cytology today for completeness - Urinalysis, Complete - Cytology, urine   Follow-up in 6 months for repeat urinalysis, consideration of repeating upper tract delayed imaging   Hollice Espy, MD

## 2019-10-13 LAB — CYTOLOGY - NON PAP

## 2019-10-22 DIAGNOSIS — S8012XA Contusion of left lower leg, initial encounter: Secondary | ICD-10-CM | POA: Diagnosis not present

## 2019-10-22 DIAGNOSIS — S8011XA Contusion of right lower leg, initial encounter: Secondary | ICD-10-CM | POA: Diagnosis not present

## 2019-10-27 ENCOUNTER — Encounter: Payer: Self-pay | Admitting: Intensive Care

## 2019-10-27 ENCOUNTER — Other Ambulatory Visit: Payer: Self-pay

## 2019-10-27 ENCOUNTER — Emergency Department
Admission: EM | Admit: 2019-10-27 | Discharge: 2019-10-27 | Disposition: A | Discharge status: Home / Self Care | Payer: Medicare HMO | Attending: Emergency Medicine | Admitting: Emergency Medicine

## 2019-10-27 ENCOUNTER — Emergency Department: Payer: Medicare HMO

## 2019-10-27 DIAGNOSIS — S8012XD Contusion of left lower leg, subsequent encounter: Secondary | ICD-10-CM | POA: Diagnosis not present

## 2019-10-27 DIAGNOSIS — S8010XD Contusion of unspecified lower leg, subsequent encounter: Secondary | ICD-10-CM

## 2019-10-27 DIAGNOSIS — S8011XD Contusion of right lower leg, subsequent encounter: Secondary | ICD-10-CM | POA: Diagnosis not present

## 2019-10-27 DIAGNOSIS — S8011XA Contusion of right lower leg, initial encounter: Secondary | ICD-10-CM | POA: Diagnosis not present

## 2019-10-27 DIAGNOSIS — M7989 Other specified soft tissue disorders: Secondary | ICD-10-CM | POA: Insufficient documentation

## 2019-10-27 DIAGNOSIS — Z7982 Long term (current) use of aspirin: Secondary | ICD-10-CM | POA: Diagnosis not present
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
## 2019-11-16 ENCOUNTER — Ambulatory Visit: Payer: Medicare HMO | Admitting: Gastroenterology

## 2019-12-16 DIAGNOSIS — R0600 Dyspnea, unspecified: Secondary | ICD-10-CM | POA: Diagnosis not present

## 2019-12-16 DIAGNOSIS — R0789 Other chest pain: Secondary | ICD-10-CM | POA: Diagnosis not present

## 2019-12-16 DIAGNOSIS — Z79899 Other long term (current) drug therapy: Secondary | ICD-10-CM | POA: Diagnosis not present

## 2019-12-23 DIAGNOSIS — N1831 Chronic kidney disease, stage 3a: Secondary | ICD-10-CM | POA: Diagnosis not present

## 2019-12-23 DIAGNOSIS — I10 Essential (primary) hypertension: Secondary | ICD-10-CM | POA: Diagnosis not present

## 2019-12-23 DIAGNOSIS — E7800 Pure hypercholesterolemia, unspecified: Secondary | ICD-10-CM | POA: Diagnosis not present

## 2019-12-23 DIAGNOSIS — R0602 Shortness of breath: Secondary | ICD-10-CM | POA: Diagnosis not present

## 2020-01-21 DIAGNOSIS — R0602 Shortness of breath: Secondary | ICD-10-CM | POA: Diagnosis not present

## 2020-01-25 DIAGNOSIS — E7800 Pure hypercholesterolemia, unspecified: Secondary | ICD-10-CM | POA: Diagnosis not present

## 2020-01-25 DIAGNOSIS — R0602 Shortness of breath: Secondary | ICD-10-CM | POA: Diagnosis not present

## 2020-01-25 DIAGNOSIS — I10 Essential (primary) hypertension: Secondary | ICD-10-CM | POA: Diagnosis not present

## 2020-01-25 DIAGNOSIS — I471 Supraventricular tachycardia: Secondary | ICD-10-CM | POA: Diagnosis not present

## 2020-01-27 DIAGNOSIS — D508 Other iron deficiency anemias: Secondary | ICD-10-CM | POA: Diagnosis not present

## 2020-04-11 ENCOUNTER — Ambulatory Visit: Payer: Self-pay | Admitting: Urology

## 2020-05-09 DIAGNOSIS — Z79899 Other long term (current) drug therapy: Secondary | ICD-10-CM | POA: Diagnosis not present

## 2020-05-09 DIAGNOSIS — I10 Essential (primary) hypertension: Secondary | ICD-10-CM | POA: Diagnosis not present

## 2020-05-15 DIAGNOSIS — Z1231 Encounter for screening mammogram for malignant neoplasm of breast: Secondary | ICD-10-CM | POA: Diagnosis not present

## 2020-05-15 DIAGNOSIS — E785 Hyperlipidemia, unspecified: Secondary | ICD-10-CM | POA: Diagnosis not present

## 2020-05-15 DIAGNOSIS — Z0000 Encounter for general adult medical examination without abnormal findings: Secondary | ICD-10-CM | POA: Diagnosis not present

## 2020-05-15 DIAGNOSIS — D649 Anemia, unspecified: Secondary | ICD-10-CM | POA: Diagnosis not present

## 2020-05-15 DIAGNOSIS — I129 Hypertensive chronic kidney disease with stage 1 through stage 4 chronic kidney disease, or unspecified chronic kidney disease: Secondary | ICD-10-CM | POA: Diagnosis not present

## 2020-05-15 DIAGNOSIS — N189 Chronic kidney disease, unspecified: Secondary | ICD-10-CM | POA: Diagnosis not present

## 2020-05-15 DIAGNOSIS — R1010 Upper abdominal pain, unspecified: Secondary | ICD-10-CM | POA: Diagnosis not present

## 2020-05-16 ENCOUNTER — Other Ambulatory Visit: Payer: Self-pay | Admitting: Internal Medicine

## 2020-05-16 DIAGNOSIS — R1010 Upper abdominal pain, unspecified: Secondary | ICD-10-CM

## 2020-05-17 ENCOUNTER — Other Ambulatory Visit: Payer: Self-pay | Admitting: Internal Medicine

## 2020-05-17 DIAGNOSIS — Z1231 Encounter for screening mammogram for malignant neoplasm of breast: Secondary | ICD-10-CM

## 2020-06-15 DIAGNOSIS — Z0100 Encounter for examination of eyes and vision without abnormal findings: Secondary | ICD-10-CM | POA: Diagnosis not present

## 2020-06-15 DIAGNOSIS — H35031 Hypertensive retinopathy, right eye: Secondary | ICD-10-CM | POA: Diagnosis not present

## 2020-08-03 ENCOUNTER — Other Ambulatory Visit: Payer: Self-pay

## 2020-08-03 ENCOUNTER — Ambulatory Visit
Admission: RE | Admit: 2020-08-03 | Discharge: 2020-08-03 | Disposition: A | Discharge status: Home / Self Care | Payer: Medicare HMO | Source: Ambulatory Visit | Attending: Internal Medicine | Admitting: Internal Medicine

## 2020-08-03 DIAGNOSIS — Z1231 Encounter for screening mammogram for malignant neoplasm of breast: Secondary | ICD-10-CM | POA: Diagnosis not present

## 2020-10-12 DIAGNOSIS — I10 Essential (primary) hypertension: Secondary | ICD-10-CM | POA: Diagnosis not present

## 2020-10-12 DIAGNOSIS — E7800 Pure hypercholesterolemia, unspecified: Secondary | ICD-10-CM | POA: Diagnosis not present

## 2020-10-12 DIAGNOSIS — Z79899 Other long term (current) drug therapy: Secondary | ICD-10-CM | POA: Diagnosis not present

## 2020-10-12 DIAGNOSIS — Z0000 Encounter for general adult medical examination without abnormal findings: Secondary | ICD-10-CM | POA: Diagnosis not present

## 2020-10-14 IMAGING — CT CT CHEST W/O CM
2 of 4 series · 15 of 36 positions shown, 18 images · non-contrast
Comparison: 09/23/2016 and 10/04/2014.

CLINICAL DATA: Pulmonary nodules.

EXAM:
CT CHEST WITHOUT CONTRAST
TECHNIQUE: Multidetector CT imaging of the chest was performed following the
standard protocol without IV contrast.

[Series 2: chest · axial · 0.52mm/px · z∈[-1213,-935]mm · 12 of 165 slices shown, 15 images (1 of 2)]
[im 13/165  mediastinal]
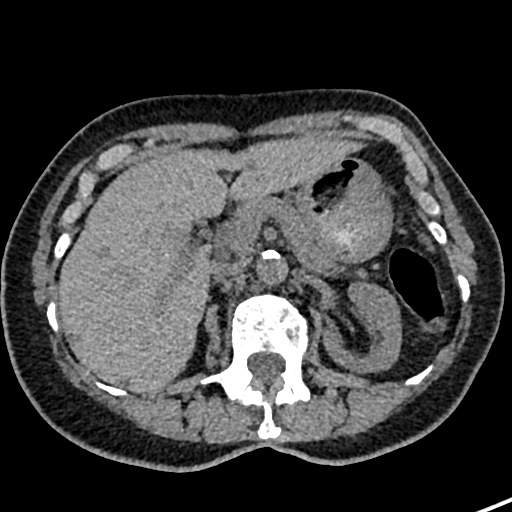
[im 13/165  lung]
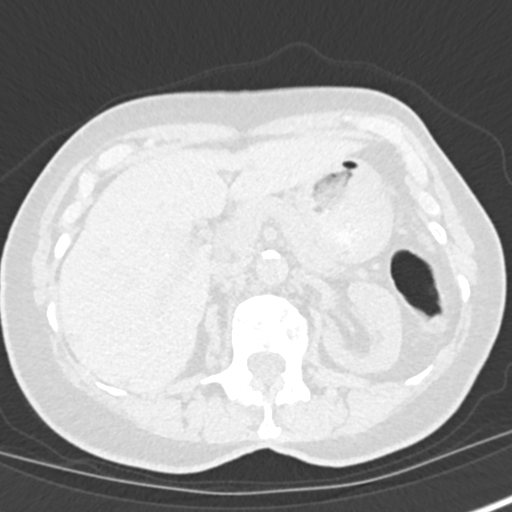
[im 26/165  lung]
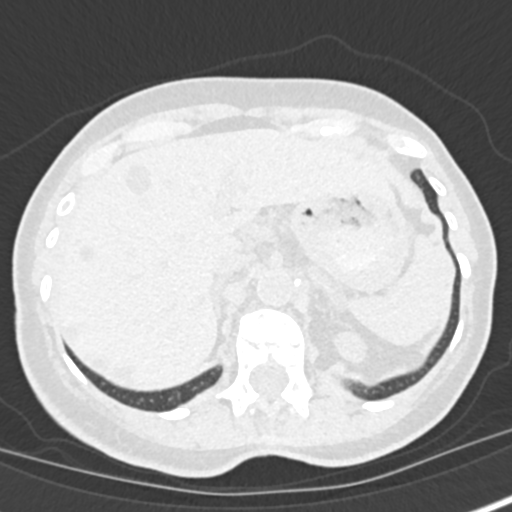
[im 38/165  lung]
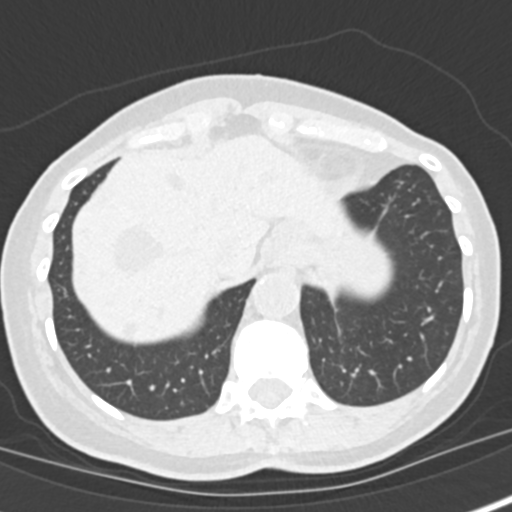
[im 51/165  lung]
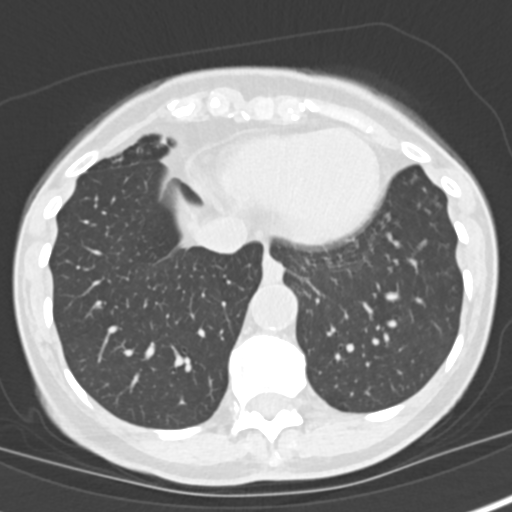
[im 64/165  mediastinal]
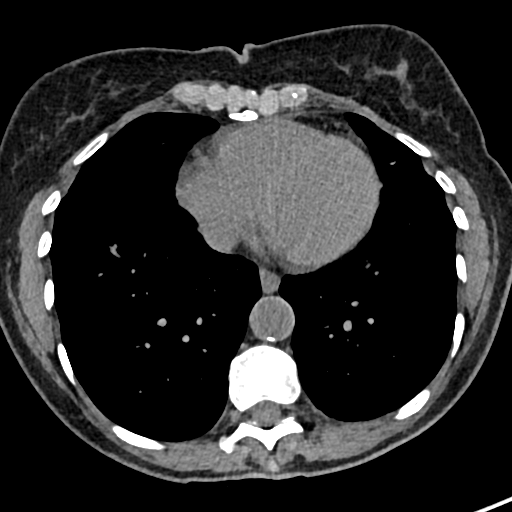
[im 64/165  lung]
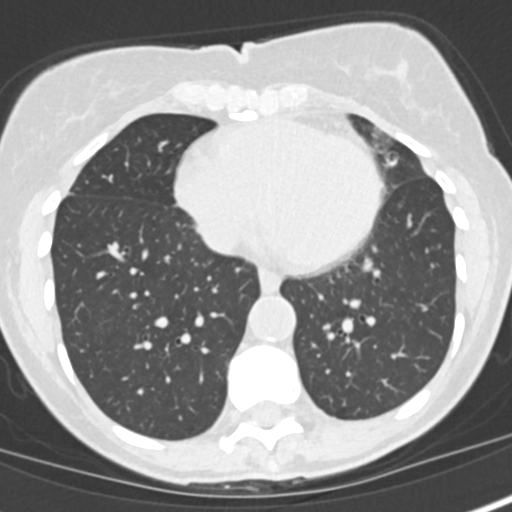
[im 76/165  lung]
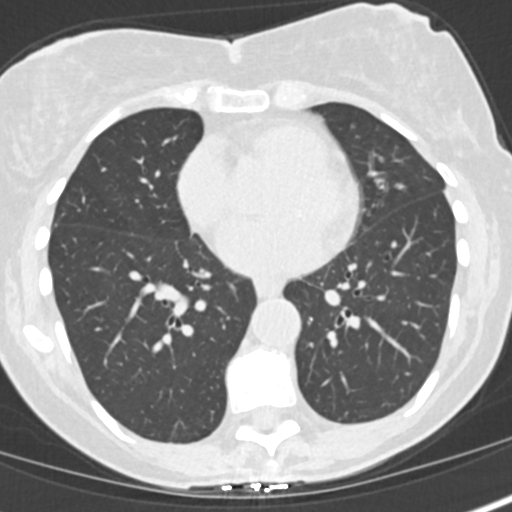
[im 89/165  lung]
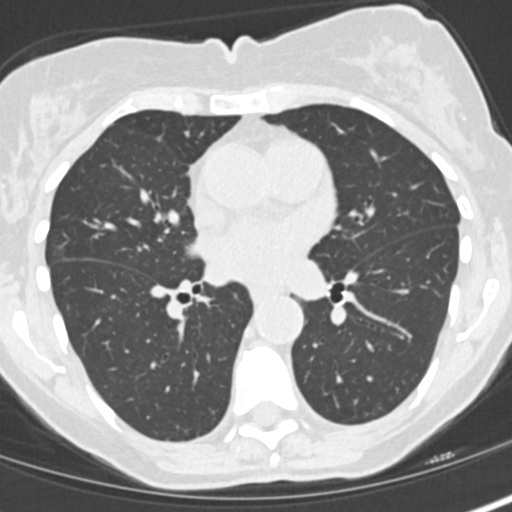
[im 101/165  lung]
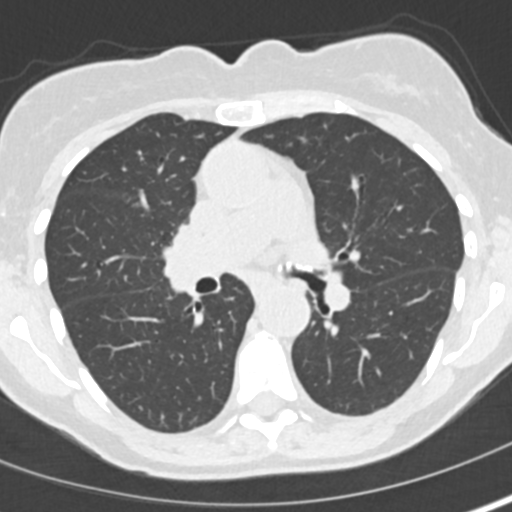
[im 114/165  mediastinal]
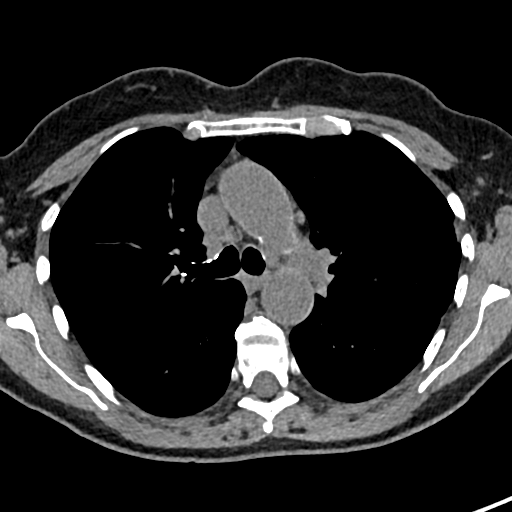
[im 114/165  lung]
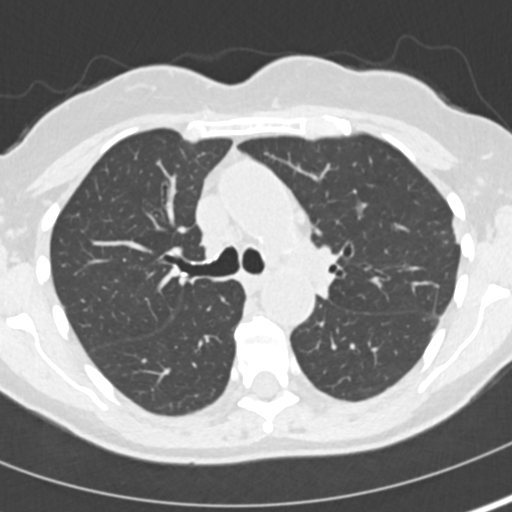
[im 127/165  lung]
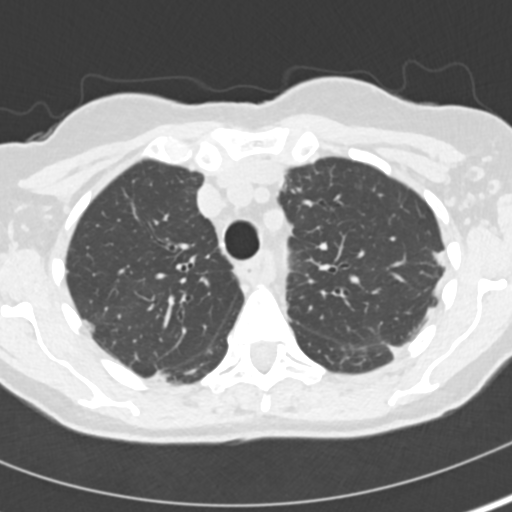
[im 139/165  lung]
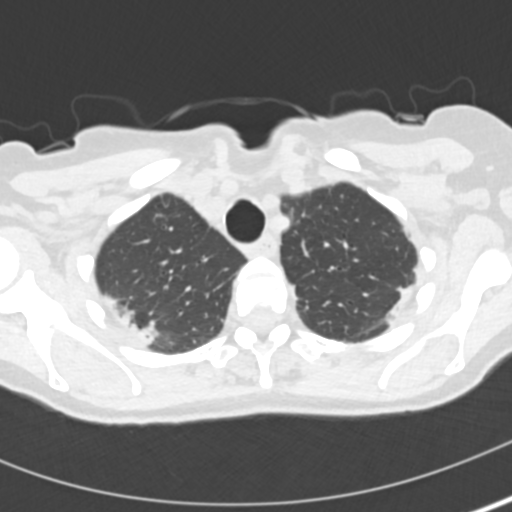
[im 152/165  lung]
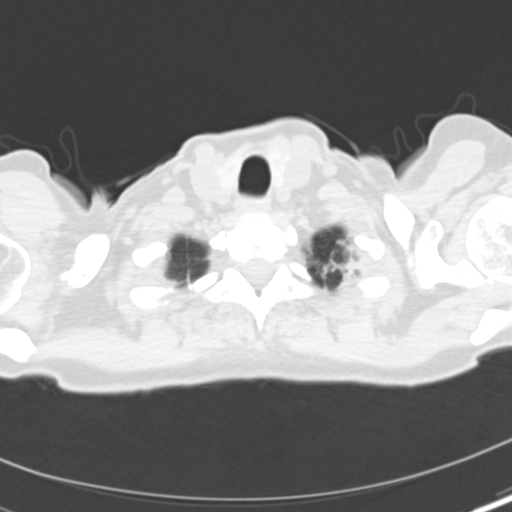

[Series 5: chest · coronal · 0.52mm/px · 3 of 127 slices shown (2 of 2)]
[im 26/127  lung]
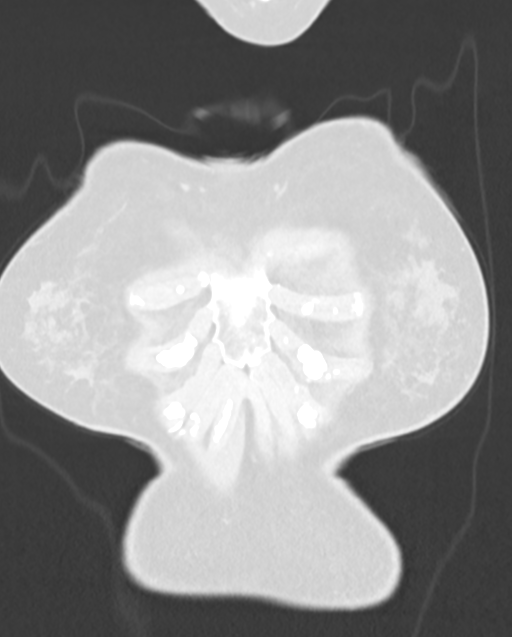
[im 51/127  lung]
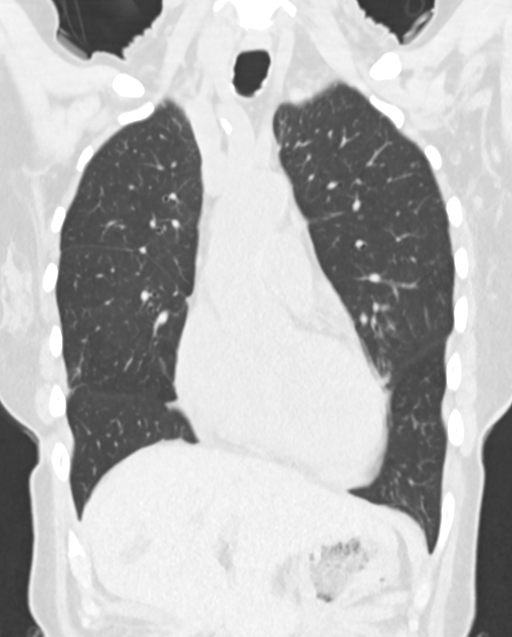
[im 76/127  lung]
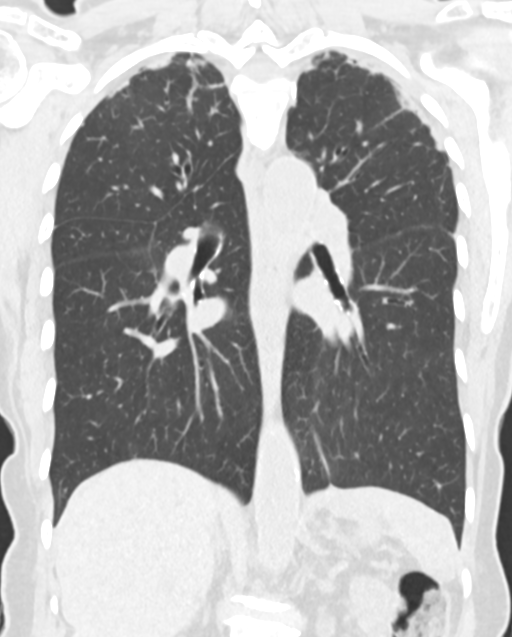

[15 of 36 positions shown; findings below may reference images not displayed]

FINDINGS: Cardiovascular: Atherosclerotic calcification of the arterial
vasculature, including coronary arteries. Heart size normal. No
pericardial effusion.

Mediastinum/Nodes: Mediastinal lymph nodes are not enlarged by CT
size criteria. Hilar regions are difficult to evaluate without IV
contrast. No axillary adenopathy. Esophagus is grossly unremarkable.

Lungs/Pleura: Biapical pleuroparenchymal scarring with subpleural
extension inferiorly. Scattered mucoid impaction. Pulmonary nodules
measure up to 3 mm in the left lower lobe, adjacent to the major
fissure, as before. Peribronchovascular nodularity in the lingula is
unchanged, indicative of post infectious scarring. Image quality at
the lung bases is compromised by motion. No pleural fluid. Airway is
unremarkable.

Upper Abdomen: Numerous low-attenuation lesions in the liver are
again seen and measure up to 2.5 cm in the dome. Visualized portions
of the adrenal glands, left kidney, spleen, pancreas, stomach and
bowel are grossly unremarkable. No upper abdominal adenopathy.

Musculoskeletal: Degenerative changes in the spine.
IMPRESSION: 1. Scattered pulmonary nodules are unchanged from 09/23/2016 and
considered benign.
2. Aortic atherosclerosis ([id number]-170.0). Coronary artery
calcification

## 2020-10-26 DIAGNOSIS — R944 Abnormal results of kidney function studies: Secondary | ICD-10-CM | POA: Diagnosis not present

## 2020-10-26 DIAGNOSIS — E875 Hyperkalemia: Secondary | ICD-10-CM | POA: Diagnosis not present

## 2021-01-16 DIAGNOSIS — I471 Supraventricular tachycardia: Secondary | ICD-10-CM | POA: Diagnosis not present

## 2021-01-16 DIAGNOSIS — E7800 Pure hypercholesterolemia, unspecified: Secondary | ICD-10-CM | POA: Diagnosis not present

## 2021-01-16 DIAGNOSIS — Z87891 Personal history of nicotine dependence: Secondary | ICD-10-CM | POA: Diagnosis not present

## 2021-01-16 DIAGNOSIS — D508 Other iron deficiency anemias: Secondary | ICD-10-CM | POA: Diagnosis not present

## 2021-01-16 DIAGNOSIS — I129 Hypertensive chronic kidney disease with stage 1 through stage 4 chronic kidney disease, or unspecified chronic kidney disease: Secondary | ICD-10-CM | POA: Diagnosis not present

## 2021-01-16 DIAGNOSIS — N1831 Chronic kidney disease, stage 3a: Secondary | ICD-10-CM | POA: Diagnosis not present

## 2021-01-16 DIAGNOSIS — Z79899 Other long term (current) drug therapy: Secondary | ICD-10-CM | POA: Diagnosis not present

## 2021-02-16 DIAGNOSIS — N1831 Chronic kidney disease, stage 3a: Secondary | ICD-10-CM | POA: Diagnosis not present

## 2021-02-16 DIAGNOSIS — D508 Other iron deficiency anemias: Secondary | ICD-10-CM | POA: Diagnosis not present

## 2021-04-26 DIAGNOSIS — R109 Unspecified abdominal pain: Secondary | ICD-10-CM | POA: Diagnosis not present

## 2021-04-26 DIAGNOSIS — Z79899 Other long term (current) drug therapy: Secondary | ICD-10-CM | POA: Diagnosis not present

## 2021-04-26 DIAGNOSIS — N189 Chronic kidney disease, unspecified: Secondary | ICD-10-CM | POA: Diagnosis not present

## 2021-04-26 DIAGNOSIS — E785 Hyperlipidemia, unspecified: Secondary | ICD-10-CM | POA: Diagnosis not present

## 2021-04-26 DIAGNOSIS — D509 Iron deficiency anemia, unspecified: Secondary | ICD-10-CM | POA: Diagnosis not present

## 2021-04-26 DIAGNOSIS — Z0000 Encounter for general adult medical examination without abnormal findings: Secondary | ICD-10-CM | POA: Diagnosis not present

## 2021-04-26 DIAGNOSIS — I129 Hypertensive chronic kidney disease with stage 1 through stage 4 chronic kidney disease, or unspecified chronic kidney disease: Secondary | ICD-10-CM | POA: Diagnosis not present

## 2021-04-27 ENCOUNTER — Other Ambulatory Visit: Payer: Self-pay | Admitting: Internal Medicine

## 2021-04-27 DIAGNOSIS — R1084 Generalized abdominal pain: Secondary | ICD-10-CM

## 2021-05-03 ENCOUNTER — Ambulatory Visit
Admission: RE | Admit: 2021-05-03 | Discharge: 2021-05-03 | Disposition: A | Discharge status: Home / Self Care | Payer: Medicare HMO | Source: Ambulatory Visit | Attending: Internal Medicine | Admitting: Internal Medicine

## 2021-05-03 DIAGNOSIS — R1084 Generalized abdominal pain: Secondary | ICD-10-CM | POA: Diagnosis not present

## 2021-05-03 DIAGNOSIS — K7689 Other specified diseases of liver: Secondary | ICD-10-CM | POA: Diagnosis not present

## 2021-07-19 ENCOUNTER — Other Ambulatory Visit: Payer: Self-pay | Admitting: Internal Medicine

## 2021-07-19 DIAGNOSIS — Z1231 Encounter for screening mammogram for malignant neoplasm of breast: Secondary | ICD-10-CM

## 2021-07-31 DIAGNOSIS — Z87891 Personal history of nicotine dependence: Secondary | ICD-10-CM | POA: Diagnosis not present

## 2021-07-31 DIAGNOSIS — I129 Hypertensive chronic kidney disease with stage 1 through stage 4 chronic kidney disease, or unspecified chronic kidney disease: Secondary | ICD-10-CM | POA: Diagnosis not present

## 2021-07-31 DIAGNOSIS — R059 Cough, unspecified: Secondary | ICD-10-CM | POA: Diagnosis not present

## 2021-07-31 DIAGNOSIS — R5383 Other fatigue: Secondary | ICD-10-CM | POA: Diagnosis not present

## 2021-07-31 DIAGNOSIS — K219 Gastro-esophageal reflux disease without esophagitis: Secondary | ICD-10-CM | POA: Diagnosis not present

## 2021-07-31 DIAGNOSIS — E7800 Pure hypercholesterolemia, unspecified: Secondary | ICD-10-CM | POA: Diagnosis not present

## 2021-07-31 DIAGNOSIS — Z79899 Other long term (current) drug therapy: Secondary | ICD-10-CM | POA: Diagnosis not present

## 2021-07-31 DIAGNOSIS — R5381 Other malaise: Secondary | ICD-10-CM | POA: Diagnosis not present

## 2021-07-31 DIAGNOSIS — N1831 Chronic kidney disease, stage 3a: Secondary | ICD-10-CM | POA: Diagnosis not present

## 2021-07-31 DIAGNOSIS — R053 Chronic cough: Secondary | ICD-10-CM | POA: Diagnosis not present

## 2021-08-21 ENCOUNTER — Ambulatory Visit
Admission: RE | Admit: 2021-08-21 | Discharge: 2021-08-21 | Disposition: A | Discharge status: Home / Self Care | Payer: Medicare HMO | Source: Ambulatory Visit | Attending: Internal Medicine | Admitting: Internal Medicine

## 2021-08-21 DIAGNOSIS — Z1231 Encounter for screening mammogram for malignant neoplasm of breast: Secondary | ICD-10-CM | POA: Diagnosis not present

## 2021-11-01 DIAGNOSIS — Z0000 Encounter for general adult medical examination without abnormal findings: Secondary | ICD-10-CM | POA: Diagnosis not present

## 2021-11-01 DIAGNOSIS — Z79899 Other long term (current) drug therapy: Secondary | ICD-10-CM | POA: Diagnosis not present

## 2021-11-01 DIAGNOSIS — D508 Other iron deficiency anemias: Secondary | ICD-10-CM | POA: Diagnosis not present

## 2021-11-01 DIAGNOSIS — Z23 Encounter for immunization: Secondary | ICD-10-CM | POA: Diagnosis not present

## 2021-11-01 DIAGNOSIS — Z87891 Personal history of nicotine dependence: Secondary | ICD-10-CM | POA: Diagnosis not present

## 2021-11-01 DIAGNOSIS — M79671 Pain in right foot: Secondary | ICD-10-CM | POA: Diagnosis not present

## 2021-11-01 DIAGNOSIS — I129 Hypertensive chronic kidney disease with stage 1 through stage 4 chronic kidney disease, or unspecified chronic kidney disease: Secondary | ICD-10-CM | POA: Diagnosis not present

## 2021-11-01 DIAGNOSIS — N1831 Chronic kidney disease, stage 3a: Secondary | ICD-10-CM | POA: Diagnosis not present

## 2021-11-01 DIAGNOSIS — E7800 Pure hypercholesterolemia, unspecified: Secondary | ICD-10-CM | POA: Diagnosis not present

## 2021-11-14 DIAGNOSIS — G8929 Other chronic pain: Secondary | ICD-10-CM | POA: Diagnosis not present

## 2021-11-14 DIAGNOSIS — R1031 Right lower quadrant pain: Secondary | ICD-10-CM | POA: Diagnosis not present

## 2021-11-14 DIAGNOSIS — R140 Abdominal distension (gaseous): Secondary | ICD-10-CM | POA: Diagnosis not present

## 2021-11-14 DIAGNOSIS — R198 Other specified symptoms and signs involving the digestive system and abdomen: Secondary | ICD-10-CM | POA: Diagnosis not present

## 2021-11-14 DIAGNOSIS — R143 Flatulence: Secondary | ICD-10-CM | POA: Diagnosis not present

## 2021-11-19 DIAGNOSIS — Z0100 Encounter for examination of eyes and vision without abnormal findings: Secondary | ICD-10-CM | POA: Diagnosis not present

## 2021-11-19 DIAGNOSIS — H43813 Vitreous degeneration, bilateral: Secondary | ICD-10-CM | POA: Diagnosis not present

## 2021-11-21 DIAGNOSIS — M792 Neuralgia and neuritis, unspecified: Secondary | ICD-10-CM | POA: Diagnosis not present

## 2021-11-21 DIAGNOSIS — M216X1 Other acquired deformities of right foot: Secondary | ICD-10-CM | POA: Diagnosis not present

## 2021-11-21 DIAGNOSIS — M2041 Other hammer toe(s) (acquired), right foot: Secondary | ICD-10-CM | POA: Diagnosis not present

## 2021-11-21 DIAGNOSIS — G5781 Other specified mononeuropathies of right lower limb: Secondary | ICD-10-CM | POA: Diagnosis not present

## 2021-11-21 DIAGNOSIS — L909 Atrophic disorder of skin, unspecified: Secondary | ICD-10-CM | POA: Diagnosis not present

## 2021-11-21 DIAGNOSIS — M2021 Hallux rigidus, right foot: Secondary | ICD-10-CM | POA: Diagnosis not present

## 2021-11-21 DIAGNOSIS — M545 Low back pain, unspecified: Secondary | ICD-10-CM | POA: Diagnosis not present

## 2021-11-21 DIAGNOSIS — M25871 Other specified joint disorders, right ankle and foot: Secondary | ICD-10-CM | POA: Diagnosis not present

## 2021-11-21 DIAGNOSIS — M25872 Other specified joint disorders, left ankle and foot: Secondary | ICD-10-CM | POA: Diagnosis not present

## 2021-12-04 DIAGNOSIS — I6523 Occlusion and stenosis of bilateral carotid arteries: Secondary | ICD-10-CM | POA: Diagnosis not present

## 2021-12-04 DIAGNOSIS — H539 Unspecified visual disturbance: Secondary | ICD-10-CM | POA: Diagnosis not present

## 2021-12-18 DIAGNOSIS — Z79899 Other long term (current) drug therapy: Secondary | ICD-10-CM | POA: Diagnosis not present

## 2021-12-18 DIAGNOSIS — N1831 Chronic kidney disease, stage 3a: Secondary | ICD-10-CM | POA: Diagnosis not present

## 2021-12-18 DIAGNOSIS — E7800 Pure hypercholesterolemia, unspecified: Secondary | ICD-10-CM | POA: Diagnosis not present

## 2021-12-18 DIAGNOSIS — I129 Hypertensive chronic kidney disease with stage 1 through stage 4 chronic kidney disease, or unspecified chronic kidney disease: Secondary | ICD-10-CM | POA: Diagnosis not present

## 2021-12-18 DIAGNOSIS — Z87891 Personal history of nicotine dependence: Secondary | ICD-10-CM | POA: Diagnosis not present

## 2022-03-25 DIAGNOSIS — K219 Gastro-esophageal reflux disease without esophagitis: Secondary | ICD-10-CM | POA: Diagnosis not present

## 2022-03-25 DIAGNOSIS — Z79899 Other long term (current) drug therapy: Secondary | ICD-10-CM | POA: Diagnosis not present

## 2022-03-25 DIAGNOSIS — I129 Hypertensive chronic kidney disease with stage 1 through stage 4 chronic kidney disease, or unspecified chronic kidney disease: Secondary | ICD-10-CM | POA: Diagnosis not present

## 2022-03-25 DIAGNOSIS — E785 Hyperlipidemia, unspecified: Secondary | ICD-10-CM | POA: Diagnosis not present

## 2022-03-25 DIAGNOSIS — R109 Unspecified abdominal pain: Secondary | ICD-10-CM | POA: Diagnosis not present

## 2022-03-25 DIAGNOSIS — Z0000 Encounter for general adult medical examination without abnormal findings: Secondary | ICD-10-CM | POA: Diagnosis not present

## 2022-03-25 DIAGNOSIS — N189 Chronic kidney disease, unspecified: Secondary | ICD-10-CM | POA: Diagnosis not present

## 2022-03-25 DIAGNOSIS — D509 Iron deficiency anemia, unspecified: Secondary | ICD-10-CM | POA: Diagnosis not present

## 2022-06-25 DIAGNOSIS — I129 Hypertensive chronic kidney disease with stage 1 through stage 4 chronic kidney disease, or unspecified chronic kidney disease: Secondary | ICD-10-CM | POA: Diagnosis not present

## 2022-06-25 DIAGNOSIS — E7800 Pure hypercholesterolemia, unspecified: Secondary | ICD-10-CM | POA: Diagnosis not present

## 2022-06-25 DIAGNOSIS — D508 Other iron deficiency anemias: Secondary | ICD-10-CM | POA: Diagnosis not present

## 2022-06-25 DIAGNOSIS — Z79899 Other long term (current) drug therapy: Secondary | ICD-10-CM | POA: Diagnosis not present

## 2022-06-25 DIAGNOSIS — Z87891 Personal history of nicotine dependence: Secondary | ICD-10-CM | POA: Diagnosis not present

## 2022-06-25 DIAGNOSIS — N1831 Chronic kidney disease, stage 3a: Secondary | ICD-10-CM | POA: Diagnosis not present

## 2022-07-24 ENCOUNTER — Other Ambulatory Visit: Payer: Self-pay

## 2022-07-24 DIAGNOSIS — Z1231 Encounter for screening mammogram for malignant neoplasm of breast: Secondary | ICD-10-CM

## 2022-08-23 ENCOUNTER — Ambulatory Visit
Admission: RE | Admit: 2022-08-23 | Discharge: 2022-08-23 | Disposition: A | Discharge status: Home / Self Care | Payer: Medicare HMO | Source: Ambulatory Visit | Attending: Internal Medicine | Admitting: Internal Medicine

## 2022-08-23 DIAGNOSIS — Z1231 Encounter for screening mammogram for malignant neoplasm of breast: Secondary | ICD-10-CM | POA: Insufficient documentation

## 2022-11-26 DIAGNOSIS — Z0000 Encounter for general adult medical examination without abnormal findings: Secondary | ICD-10-CM | POA: Diagnosis not present

## 2022-11-26 DIAGNOSIS — D508 Other iron deficiency anemias: Secondary | ICD-10-CM | POA: Diagnosis not present

## 2022-11-26 DIAGNOSIS — I10 Essential (primary) hypertension: Secondary | ICD-10-CM | POA: Diagnosis not present

## 2022-11-26 DIAGNOSIS — E7800 Pure hypercholesterolemia, unspecified: Secondary | ICD-10-CM | POA: Diagnosis not present

## 2022-11-26 DIAGNOSIS — K219 Gastro-esophageal reflux disease without esophagitis: Secondary | ICD-10-CM | POA: Diagnosis not present

## 2022-11-26 DIAGNOSIS — Z79899 Other long term (current) drug therapy: Secondary | ICD-10-CM | POA: Diagnosis not present

## 2022-12-11 DIAGNOSIS — H11001 Unspecified pterygium of right eye: Secondary | ICD-10-CM | POA: Diagnosis not present

## 2022-12-11 DIAGNOSIS — M3501 Sicca syndrome with keratoconjunctivitis: Secondary | ICD-10-CM | POA: Diagnosis not present

## 2022-12-11 DIAGNOSIS — Z0100 Encounter for examination of eyes and vision without abnormal findings: Secondary | ICD-10-CM | POA: Diagnosis not present

## 2022-12-11 DIAGNOSIS — Z961 Presence of intraocular lens: Secondary | ICD-10-CM | POA: Diagnosis not present

## 2022-12-30 DIAGNOSIS — Z79899 Other long term (current) drug therapy: Secondary | ICD-10-CM | POA: Diagnosis not present

## 2023-03-25 DIAGNOSIS — I129 Hypertensive chronic kidney disease with stage 1 through stage 4 chronic kidney disease, or unspecified chronic kidney disease: Secondary | ICD-10-CM | POA: Diagnosis not present

## 2023-03-25 DIAGNOSIS — Z79899 Other long term (current) drug therapy: Secondary | ICD-10-CM | POA: Diagnosis not present

## 2023-03-25 DIAGNOSIS — E785 Hyperlipidemia, unspecified: Secondary | ICD-10-CM | POA: Diagnosis not present

## 2023-03-25 DIAGNOSIS — N189 Chronic kidney disease, unspecified: Secondary | ICD-10-CM | POA: Diagnosis not present

## 2023-03-25 DIAGNOSIS — K219 Gastro-esophageal reflux disease without esophagitis: Secondary | ICD-10-CM | POA: Diagnosis not present

## 2023-03-25 DIAGNOSIS — D509 Iron deficiency anemia, unspecified: Secondary | ICD-10-CM | POA: Diagnosis not present

## 2023-03-25 DIAGNOSIS — Z0000 Encounter for general adult medical examination without abnormal findings: Secondary | ICD-10-CM | POA: Diagnosis not present

## 2023-07-02 ENCOUNTER — Emergency Department

## 2023-07-02 ENCOUNTER — Other Ambulatory Visit: Payer: Self-pay

## 2023-07-02 ENCOUNTER — Observation Stay
Admission: EM | Admit: 2023-07-02 | Discharge: 2023-07-03 | Disposition: A | Discharge status: Home / Self Care | Attending: Internal Medicine | Admitting: Internal Medicine

## 2023-07-02 DIAGNOSIS — I129 Hypertensive chronic kidney disease with stage 1 through stage 4 chronic kidney disease, or unspecified chronic kidney disease: Secondary | ICD-10-CM | POA: Diagnosis not present

## 2023-07-02 DIAGNOSIS — Z79899 Other long term (current) drug therapy: Secondary | ICD-10-CM | POA: Diagnosis not present

## 2023-07-02 DIAGNOSIS — Y9289 Other specified places as the place of occurrence of the external cause: Secondary | ICD-10-CM | POA: Insufficient documentation

## 2023-07-02 DIAGNOSIS — R42 Dizziness and giddiness: Secondary | ICD-10-CM | POA: Insufficient documentation

## 2023-07-02 DIAGNOSIS — H539 Unspecified visual disturbance: Secondary | ICD-10-CM | POA: Diagnosis not present

## 2023-07-02 DIAGNOSIS — R29818 Other symptoms and signs involving the nervous system: Secondary | ICD-10-CM | POA: Diagnosis not present

## 2023-07-02 DIAGNOSIS — H43392 Other vitreous opacities, left eye: Secondary | ICD-10-CM | POA: Diagnosis not present

## 2023-07-02 DIAGNOSIS — D649 Anemia, unspecified: Secondary | ICD-10-CM | POA: Insufficient documentation

## 2023-07-02 DIAGNOSIS — H5319 Other subjective visual disturbances: Secondary | ICD-10-CM | POA: Insufficient documentation

## 2023-07-02 DIAGNOSIS — K219 Gastro-esophageal reflux disease without esophagitis: Secondary | ICD-10-CM | POA: Diagnosis not present

## 2023-07-02 DIAGNOSIS — E7800 Pure hypercholesterolemia, unspecified: Secondary | ICD-10-CM | POA: Diagnosis not present

## 2023-07-02 DIAGNOSIS — N1831 Chronic kidney disease, stage 3a: Secondary | ICD-10-CM | POA: Diagnosis not present

## 2023-07-02 DIAGNOSIS — I161 Hypertensive emergency: Principal | ICD-10-CM

## 2023-07-02 DIAGNOSIS — Z7982 Long term (current) use of aspirin: Secondary | ICD-10-CM | POA: Insufficient documentation

## 2023-07-02 DIAGNOSIS — R531 Weakness: Secondary | ICD-10-CM | POA: Diagnosis not present

## 2023-07-02 DIAGNOSIS — Z859 Personal history of malignant neoplasm, unspecified: Secondary | ICD-10-CM | POA: Diagnosis not present

## 2023-07-02 DIAGNOSIS — I214 Non-ST elevation (NSTEMI) myocardial infarction: Secondary | ICD-10-CM | POA: Insufficient documentation

## 2023-07-02 DIAGNOSIS — I480 Paroxysmal atrial fibrillation: Secondary | ICD-10-CM | POA: Diagnosis not present

## 2023-07-02 DIAGNOSIS — I674 Hypertensive encephalopathy: Secondary | ICD-10-CM | POA: Insufficient documentation

## 2023-07-02 DIAGNOSIS — W19XXXA Unspecified fall, initial encounter: Secondary | ICD-10-CM | POA: Insufficient documentation

## 2023-07-02 DIAGNOSIS — I160 Hypertensive urgency: Secondary | ICD-10-CM | POA: Diagnosis not present

## 2023-07-02 DIAGNOSIS — K929 Disease of digestive system, unspecified: Secondary | ICD-10-CM | POA: Insufficient documentation

## 2023-07-02 DIAGNOSIS — D631 Anemia in chronic kidney disease: Secondary | ICD-10-CM | POA: Diagnosis not present

## 2023-07-02 LAB — COMPREHENSIVE METABOLIC PANEL WITH GFR
ALT: 11 U/L (ref 0–44)
AST: 19 U/L (ref 15–41)
Albumin: 4.1 g/dL (ref 3.5–5.0)
Alkaline Phosphatase: 64 U/L (ref 38–126)
Anion gap: 10 (ref 5–15)
BUN: 25 mg/dL — ABNORMAL HIGH (ref 8–23)
CO2: 24 mmol/L (ref 22–32)
Calcium: 9.5 mg/dL (ref 8.9–10.3)
Chloride: 103 mmol/L (ref 98–111)
Creatinine, Ser: 1.29 mg/dL — ABNORMAL HIGH (ref 0.44–1.00)
GFR, Estimated: 40 mL/min — ABNORMAL LOW (ref >60)
Glucose, Bld: 93 mg/dL (ref 70–99)
Potassium: 4.2 mmol/L (ref 3.5–5.1)
Sodium: 137 mmol/L (ref 135–145)
Total Bilirubin: 0.5 mg/dL (ref 0.0–1.2)
Total Protein: 6.8 g/dL (ref 6.5–8.1)

## 2023-07-02 LAB — CBC WITH DIFFERENTIAL/PLATELET
Abs Immature Granulocytes: 0.02 10*3/uL (ref 0.00–0.07)
Basophils Absolute: 0.1 10*3/uL (ref 0.0–0.1)
Basophils Relative: 1 %
Eosinophils Absolute: 0.2 10*3/uL (ref 0.0–0.5)
Eosinophils Relative: 3 %
HCT: 32.2 % — ABNORMAL LOW (ref 36.0–46.0)
Hemoglobin: 10.4 g/dL — ABNORMAL LOW (ref 12.0–15.0)
Immature Granulocytes: 0 %
Lymphocytes Relative: 47 %
Lymphs Abs: 3.3 10*3/uL (ref 0.7–4.0)
MCH: 29.3 pg (ref 26.0–34.0)
MCHC: 32.3 g/dL (ref 30.0–36.0)
MCV: 90.7 fL (ref 80.0–100.0)
Monocytes Absolute: 0.8 10*3/uL (ref 0.1–1.0)
Monocytes Relative: 12 %
Neutro Abs: 2.6 10*3/uL (ref 1.7–7.7)
Neutrophils Relative %: 37 %
Platelets: 206 10*3/uL (ref 150–400)
RBC: 3.55 MIL/uL — ABNORMAL LOW (ref 3.87–5.11)
RDW: 14.2 % (ref 11.5–15.5)
WBC: 7.1 10*3/uL (ref 4.0–10.5)
nRBC: 0 % (ref 0.0–0.2)

## 2023-07-02 LAB — CBG MONITORING, ED: Glucose-Capillary: 88 mg/dL (ref 70–99)

## 2023-07-02 LAB — SEDIMENTATION RATE: Sed Rate: 26 mm/h (ref 0–30)

## 2023-07-02 LAB — PROTIME-INR
INR: 1 (ref 0.8–1.2)
Prothrombin Time: 13.1 s (ref 11.4–15.2)

## 2023-07-02 LAB — TROPONIN I (HIGH SENSITIVITY): Troponin I (High Sensitivity): 33 ng/L — ABNORMAL HIGH (ref <18)

## 2023-07-02 LAB — ETHANOL: Alcohol, Ethyl (B): <15 mg/dL (ref <15)

## 2023-07-02 LAB — APTT: aPTT: 37 s — ABNORMAL HIGH (ref 24–36)

## 2023-07-02 MED ORDER — LABETALOL HCL 5 MG/ML IV SOLN: 10.0000 mg | Freq: Once | INTRAVENOUS | Status: DC

## 2023-07-02 MED ORDER — LABETALOL HCL 5 MG/ML IV SOLN
5.0000 mg | Freq: Once | INTRAVENOUS | Status: AC
  Administered 2023-07-02: 5 mg via INTRAVENOUS
  Filled 2023-07-02: qty 4

## 2023-07-02 MED ORDER — ASPIRIN 81 MG PO CHEW
81.0000 mg | CHEWABLE_TABLET | Freq: Once | ORAL | Status: AC
  Administered 2023-07-02: 81 mg via ORAL
  Filled 2023-07-02: qty 1

## 2023-07-02 NOTE — ED Notes (Signed)
 ED Provider at bedside.

## 2023-07-02 NOTE — ED Provider Notes (Addendum)
 [facility] Provider Note    Event Date/Time   First MD Initiated Contact with Patient 07/02/23 2237     (approximate)   History   Dizziness   HPI  Carrie Rodriguez is a 89 y.o. female with a reported history of A-fib, ulcers, who comes in with history of dizziness.  Patient reports of equilibrium issues for the past week.  She reports that she took a shower at 6:00 and felt normal and around 630 she developed some flashing and floaters in her left eye and some sudden onset of dizziness.  She reports that the vision is already improved but still feels a little off balance with walking.  She denies having this vision issue happened before she reports a history of dry eyes.      Physical Exam   Triage Vital Signs: ED Triage Vitals  Encounter Vitals Group     BP 07/02/23 2209 (!) 215/79     Systolic BP Percentile --      Diastolic BP Percentile --      Pulse Rate 07/02/23 2209 78     Resp 07/02/23 2209 18     Temp 07/02/23 2209 97.8 F (36.6 C)     Temp Source 07/02/23 2209 Oral     SpO2 07/02/23 2209 98 %     Weight 07/02/23 2224 123 lb (55.8 kg)     Height 07/02/23 2224 5\' 4"  (1.626 m)     Head Circumference --      Peak Flow --      Pain Score 07/02/23 2224 0     Pain Loc --      Pain Education --      Exclude from Growth Chart --     Most recent vital signs: Vitals:   07/02/23 2228 07/02/23 2245  BP: (!) 196/75 (!) 194/76  Pulse: 71 73  Resp: 14 13  Temp:    SpO2: 99% 98%     General: Awake, no distress.  CV:  Good peripheral perfusion.  Resp:  Normal effort.  Abd:  No distention.  Other:  Cranial nerves appear intact NIH stroke scale of 0.   ED Results / Procedures / Treatments   Labs (all labs ordered are listed, but only abnormal results are displayed) Labs Reviewed  ETHANOL  PROTIME-INR  APTT  COMPREHENSIVE METABOLIC PANEL WITH GFR  URINE DRUG SCREEN, QUALITATIVE ([facility] ONLY)  CBC WITH DIFFERENTIAL/PLATELET  CBG  MONITORING, ED  TROPONIN I (HIGH SENSITIVITY)     EKG  My interpretation of EKG:  Normal sinus rate 75 without any ST elevation or T wave inversions, normal intervals  RADIOLOGY I have reviewed the CT head personally interpreted no evidence of intracranial hemorrhage   PROCEDURES:  Critical Care performed: Yes, see critical care procedure note(s)  .Critical Care  Performed by: Lubertha Rush, MD Authorized by: Lubertha Rush, MD   Critical care provider statement:    Critical care time (minutes):  30   Critical care was necessary to treat or prevent imminent or life-threatening deterioration of the following conditions:  CNS failure or compromise   Critical care was time spent personally by me on the following activities:  Development of treatment plan with patient or surrogate, discussions with consultants, evaluation of patient's response to treatment, examination of patient, ordering and review of laboratory studies, ordering and review of radiographic studies, ordering and performing treatments and interventions, pulse oximetry, re-evaluation of patient's condition and review of old charts  MEDICATIONS ORDERED IN ED: Medications  aspirin  chewable tablet 81 mg (has no administration in time range)  labetalol (NORMODYNE) injection 5 mg (has no administration in time range)     IMPRESSION / MDM / ASSESSMENT AND PLAN / ED COURSE  I reviewed the triage vital signs and the nursing notes.   Patient's presentation is most consistent with acute presentation with potential threat to life or bodily function.   Stroke code was called given within the window.  However patient reports symptoms have significantly improved she really denies any of the vision changes at this time.  Her pupils are reactive.  CT head was negative.  Discussed the case with the neurologist who recommended given she has had symptoms for a week now trying to get the blood pressure to around 160 within 4 hours  of arrival, aspirin , statin, admission for stroke workup.  I did discuss this with patient and she was agreeable.  Sed rate is negative unlikely temporal arteritis.  Troponin slightly elevated we will get a repeat  No loss of vision to suggest cRAO-pupil reactive unlikely glaucoma and symptoms improvigin.   H/o Afib not on AC unclear why- she wants aspirin , statin, BP 160 within 4 hours  Will discuss hospital team for admission  11:56 PM Bps have come down from 215 to 168 with 5 labetalol discussed with nurse that we are going to monitor every 10 minutes blood pressures and if blood pressure starts creeping back up we can give additional labetalol but given significant improvement over the first hour and a half we will hold off on giving additional dose now with risk of it going too low.  G  The patient is on the cardiac monitor to evaluate for evidence of arrhythmia and/or significant heart rate changes.      FINAL CLINICAL IMPRESSION(S) / ED DIAGNOSES   Final diagnoses:  None     Rx / DC Orders   ED Discharge Orders     None        Note:  This document was prepared using Dragon voice recognition software and may include unintentional dictation errors.   Lubertha Rush, MD 07/02/23 5621    Lubertha Rush, MD 07/02/23 [phone]

## 2023-07-02 NOTE — ED Triage Notes (Signed)
 Pt reports onset of left side vision changes and dizziness that began earlier tonight, pt states LKW 1800 tonight. Pt is not on blood thinners, denies slurred speech extremity weakness or facial droop.

## 2023-07-02 NOTE — ED Notes (Signed)
 CBG 88

## 2023-07-02 NOTE — Progress Notes (Signed)
 2215: Tele stroke cart activated at this time. Pt in CT.   2216: TSP paged at this time.   2220: Pt returned from CT at this time.   2221: EDP at bedside.   2236: TSP on cart at this time. Results of NCCT at this time.   2252: TSRN and TSP off cart at this time. TSP to follow up with EDP for plan of care.

## 2023-07-02 NOTE — Consult Note (Signed)
 [facility] Tele[facility]urology Consult Services   Patient Name:   Carrie Rodriguez, Carrie Rodriguez Date of Birth:   [birth year] Identification Number:   MRN - [id number] Date of Service:   07/02/2023 22:16:36  Diagnosis:       I67.4 - Hypertensive encephalopathy  Impression:      Hypertensive urgency:   Admit for workup and blood pressure management.   Gradually reduce blood pressure:   - Goal: No more than 25% reduction in the first hour   - Target: 160 mmHg within 4 hours   - Normatensive by the AM.   Initial medication: Labetalol 5-10 mg IV.   If ineffective, consider nicardipine.   Monitor for symptoms during blood pressure reduction.   Start aspirin  (baby dose) for secondary stroke prevention with protonix /famotidine.   Initiate statin therapy for secondary stroke prevention.   CBC, CMP, UA, TSH for mimic   LDL and A1c for risk stratification   MRI brain; CTA/MRA head and neck   2D Echo   PT, OT, SLP for rehab potential    Visual disturbances:   Further evaluation as part of inpatient workup.   Reassess symptoms after blood pressure control.    Dizziness and balance issues:   Monitor symptoms during blood pressure management.   Further evaluation as part of inpatient workup.    History of atrial fibrillation:   Review previous cardiac workup.   Determine if patient had ablation or Watchman device placement.   Assess need for anticoagulation based on CHA2DS2-VASc score and bleeding risk.    Gastrointestinal issues:   Start proton pump inhibitor (e.g., Protonix ) or H2 blocker (e.g., famotidine).   Monitor for GI symptoms.   Consider GI consultation if symptoms persist.  Our recommendations are outlined below.  Recommendations:        Stroke/Telemetry Floor       Neuro Checks (Q4)       Bedside Swallow Eval       DVT Prophylaxis       IV Fluids, Normal Saline       Head of Bed 30 Degrees       Euglycemia and Avoid Hyperthermia (PRN Acetaminophen )  Sign Out:        Discussed with Emergency Department Provider    ------------------------------------------------------------------------------  Advanced Imaging: Advanced Imaging Deferred because:  Non-disabling symptoms as verified by the patient; no cortical signs so not consistent with LVO  Does not meet criteria due to being out of the 24-hour window for thrombectomy   Metrics: Last Known Well: 07/02/2023 18:00:00 Dispatch Time: 07/02/2023 22:16:36 Arrival Time: 07/02/2023 22:06:00 Initial Response Time: 07/02/2023 22:24:24 Symptoms: vision changes and HTN. Initial patient interaction: 07/02/2023 22:40:57 NIHSS Assessment Completed: 07/02/2023 22:45:22 Patient is not a candidate for Thrombolytic. Thrombolytic Medical Decision: 07/02/2023 22:45:50 Patient was not deemed candidate for Thrombolytic because of following reasons: LKW outside 4.5 hr window. .  CT Head: I personally reviewed all the CT images that were available to me and it showed: No acute findings  Primary Provider Notified of Diagnostic Impression and Management Plan on: 07/02/2023 23:09:02    ------------------------------------------------------------------------------  History of Present Illness: Patient is a 89 year old Female.  Patient was brought by private transportation with symptoms of vision changes and HTN. Ms. Carrie Rodriguez, an 89 year old female, presents with photopsia and dizziness. She reports experiencing floaters in her vision, which she initially described as bugs flying around her periphery while sitting down. The patient has been experiencing balance issues and symptoms for the past week.  The  patient reports feeling dizzy after coming out of the shower. When questioned about the nature of her dizziness, she did not specify whether it felt like the room was spinning or like being on a rocking boat. However, upon standing, she described it as being on rocking boat. She denies having a current headache or  a history of migraines.  Ms. Carrie Rodriguez has a medical history that includes atrial fibrillation diagnosed 6 years ago, hypertension, and an ulcer. She is currently not on any anticoagulation therapy and denies taking aspirin  or other blood thinners.  Upon review of systems, the patient reports dizziness and visual disturbances (floaters). She denies chest pain, headache, room spinning sensation, or feeling like on a rocking boat. The patient's balance issues have been ongoing for about a week.  As of note, the patient is unable to take aspirin  or Plavix due to her history of ulcer. though she denies and blood in stool. She reports being regular with her blood pressure medication.   Past Medical History:      Hypertension      Hyperlipidemia  Medications:  No Anticoagulant use  Antiplatelet use: Yes ASA 81mg  Reviewed EMR for current medications  Allergies:  Reviewed  Social History: Smoking: No Alcohol Use: No Drug Use: No  Family History:  There is no family history of premature cerebrovascular disease pertinent to this consultation  ROS : 14 Points Review of Systems was performed and was negative except mentioned in HPI.  Past Surgical History: There Is No Surgical History Contributory To Today's Visit    Examination: BP(197/65), Pulse(68), 1A: Level of Consciousness - Alert; keenly responsive + 0 1B: Ask Month and Age - Both Questions Right + 0 1C: Blink Eyes & Squeeze Hands - Performs Both Tasks + 0 2: Test Horizontal Extraocular Movements - Normal + 0 3: Test Visual Fields - No Visual Loss + 0 4: Test Facial Palsy (Use Grimace if Obtunded) - Normal symmetry + 0 5A: Test Left Arm Motor Drift - No Drift for 10 Seconds + 0 5B: Test Right Arm Motor Drift - No Drift for 10 Seconds + 0 6A: Test Left Leg Motor Drift - No Drift for 5 Seconds + 0 6B: Test Right Leg Motor Drift - No Drift for 5 Seconds + 0 7: Test Limb Ataxia (FNF/Heel-Shin) - No Ataxia + 0 8: Test  Sensation - Normal; No sensory loss + 0 9: Test Language/Aphasia - Normal; No aphasia + 0 10: Test Dysarthria - Normal + 0 11: Test Extinction/Inattention - No abnormality + 0  NIHSS Score: 0   Pre-Morbid Modified Rankin Scale: 0 Points = No symptoms at all  Spoke with : Dr. Peggi Bowels  This consult was conducted in real time using interactive audio and Immunologist. Patient was informed of the technology being used for this visit and agreed to proceed. Patient located in hospital and provider located at home/office setting.   Patient is being evaluated for possible acute neurologic impairment and high probability of imminent or life-threatening deterioration. I spent total of 35 minutes providing care to this patient, including time for face to face visit via telemedicine, review of medical records, imaging studies and discussion of findings with providers, the patient and/or family.   Dr [place]   [facility] For Inpatient follow-up with [facility] physician please call [facility] at [phone]. As we are not an outpatient service for any post hospital discharge needs please contact the hospital for assistance. If you have any questions for the [facility]  physicians or need to reconsult for clinical or diagnostic changes please contact us  via [facility] at [phone].

## 2023-07-02 NOTE — ED Notes (Signed)
Tele neuro assessing pt at this time.

## 2023-07-02 NOTE — ED Notes (Signed)
 CODE STROKE CALLED OFF AT THIS TIME

## 2023-07-02 NOTE — ED Notes (Signed)
 Pt to CT @ 2214 Stroke cart activated @ 2215

## 2023-07-03 ENCOUNTER — Other Ambulatory Visit: Payer: Self-pay

## 2023-07-03 ENCOUNTER — Observation Stay (HOSPITAL_BASED_OUTPATIENT_CLINIC_OR_DEPARTMENT_OTHER)
Admit: 2023-07-03 | Discharge: 2023-07-03 | Disposition: A | Discharge status: Home / Self Care | Attending: Internal Medicine | Admitting: Internal Medicine

## 2023-07-03 ENCOUNTER — Emergency Department

## 2023-07-03 DIAGNOSIS — I480 Paroxysmal atrial fibrillation: Secondary | ICD-10-CM

## 2023-07-03 DIAGNOSIS — K219 Gastro-esophageal reflux disease without esophagitis: Secondary | ICD-10-CM | POA: Insufficient documentation

## 2023-07-03 DIAGNOSIS — R519 Headache, unspecified: Secondary | ICD-10-CM | POA: Diagnosis not present

## 2023-07-03 DIAGNOSIS — Z859 Personal history of malignant neoplasm, unspecified: Secondary | ICD-10-CM | POA: Insufficient documentation

## 2023-07-03 DIAGNOSIS — I674 Hypertensive encephalopathy: Secondary | ICD-10-CM

## 2023-07-03 DIAGNOSIS — W19XXXA Unspecified fall, initial encounter: Secondary | ICD-10-CM | POA: Insufficient documentation

## 2023-07-03 DIAGNOSIS — N1831 Chronic kidney disease, stage 3a: Secondary | ICD-10-CM | POA: Insufficient documentation

## 2023-07-03 DIAGNOSIS — I10 Essential (primary) hypertension: Secondary | ICD-10-CM | POA: Diagnosis not present

## 2023-07-03 DIAGNOSIS — D649 Anemia, unspecified: Secondary | ICD-10-CM | POA: Insufficient documentation

## 2023-07-03 DIAGNOSIS — N183 Chronic kidney disease, stage 3 unspecified: Secondary | ICD-10-CM | POA: Diagnosis not present

## 2023-07-03 DIAGNOSIS — I214 Non-ST elevation (NSTEMI) myocardial infarction: Principal | ICD-10-CM | POA: Insufficient documentation

## 2023-07-03 DIAGNOSIS — R531 Weakness: Secondary | ICD-10-CM | POA: Diagnosis not present

## 2023-07-03 DIAGNOSIS — E785 Hyperlipidemia, unspecified: Secondary | ICD-10-CM | POA: Diagnosis not present

## 2023-07-03 DIAGNOSIS — R29818 Other symptoms and signs involving the nervous system: Secondary | ICD-10-CM | POA: Diagnosis not present

## 2023-07-03 DIAGNOSIS — I2489 Other forms of acute ischemic heart disease: Secondary | ICD-10-CM | POA: Diagnosis not present

## 2023-07-03 DIAGNOSIS — E7800 Pure hypercholesterolemia, unspecified: Secondary | ICD-10-CM

## 2023-07-03 DIAGNOSIS — I129 Hypertensive chronic kidney disease with stage 1 through stage 4 chronic kidney disease, or unspecified chronic kidney disease: Secondary | ICD-10-CM | POA: Insufficient documentation

## 2023-07-03 DIAGNOSIS — Y9289 Other specified places as the place of occurrence of the external cause: Secondary | ICD-10-CM | POA: Insufficient documentation

## 2023-07-03 DIAGNOSIS — R42 Dizziness and giddiness: Secondary | ICD-10-CM | POA: Insufficient documentation

## 2023-07-03 DIAGNOSIS — I6782 Cerebral ischemia: Secondary | ICD-10-CM | POA: Diagnosis not present

## 2023-07-03 DIAGNOSIS — R442 Other hallucinations: Secondary | ICD-10-CM | POA: Diagnosis not present

## 2023-07-03 DIAGNOSIS — Z79899 Other long term (current) drug therapy: Secondary | ICD-10-CM | POA: Insufficient documentation

## 2023-07-03 LAB — ECHOCARDIOGRAM COMPLETE
Area-P 1/2: 3.85 cm2
Height: 64 in
S' Lateral: 2.6 cm
Weight: 1968 [oz_av]

## 2023-07-03 LAB — CBC
HCT: 32 % — ABNORMAL LOW (ref 36.0–46.0)
Hemoglobin: 10.4 g/dL — ABNORMAL LOW (ref 12.0–15.0)
MCH: 29.1 pg (ref 26.0–34.0)
MCHC: 32.5 g/dL (ref 30.0–36.0)
MCV: 89.4 fL (ref 80.0–100.0)
Platelets: 196 10*3/uL (ref 150–400)
RBC: 3.58 MIL/uL — ABNORMAL LOW (ref 3.87–5.11)
RDW: 14.4 % (ref 11.5–15.5)
WBC: 7.6 10*3/uL (ref 4.0–10.5)
nRBC: 0 % (ref 0.0–0.2)

## 2023-07-03 LAB — RETICULOCYTES
Immature Retic Fract: 8.1 % (ref 2.3–15.9)
RBC.: 3.72 MIL/uL — ABNORMAL LOW (ref 3.87–5.11)
Retic Count, Absolute: 44.3 10*3/uL (ref 19.0–186.0)
Retic Ct Pct: 1.2 % (ref 0.4–3.1)

## 2023-07-03 LAB — COMPREHENSIVE METABOLIC PANEL WITH GFR
ALT: 11 U/L (ref 0–44)
AST: 21 U/L (ref 15–41)
Albumin: 4 g/dL (ref 3.5–5.0)
Alkaline Phosphatase: 57 U/L (ref 38–126)
Anion gap: 7 (ref 5–15)
BUN: 22 mg/dL (ref 8–23)
CO2: 22 mmol/L (ref 22–32)
Calcium: 8.6 mg/dL — ABNORMAL LOW (ref 8.9–10.3)
Chloride: 104 mmol/L (ref 98–111)
Creatinine, Ser: 1.16 mg/dL — ABNORMAL HIGH (ref 0.44–1.00)
GFR, Estimated: 46 mL/min — ABNORMAL LOW (ref >60)
Glucose, Bld: 104 mg/dL — ABNORMAL HIGH (ref 70–99)
Potassium: 3.6 mmol/L (ref 3.5–5.1)
Sodium: 133 mmol/L — ABNORMAL LOW (ref 135–145)
Total Bilirubin: 0.8 mg/dL (ref 0.0–1.2)
Total Protein: 6.8 g/dL (ref 6.5–8.1)

## 2023-07-03 LAB — URINE DRUG SCREEN, QUALITATIVE (ARMC ONLY)
Amphetamines, Ur Screen: NOT DETECTED
Barbiturates, Ur Screen: NOT DETECTED
Benzodiazepine, Ur Scrn: NOT DETECTED
Cannabinoid 50 Ng, Ur ~~LOC~~: NOT DETECTED
Cocaine Metabolite,Ur ~~LOC~~: NOT DETECTED
MDMA (Ecstasy)Ur Screen: NOT DETECTED
Methadone Scn, Ur: NOT DETECTED
Opiate, Ur Screen: NOT DETECTED
Phencyclidine (PCP) Ur S: NOT DETECTED
Tricyclic, Ur Screen: NOT DETECTED

## 2023-07-03 LAB — LIPID PANEL
Cholesterol: 228 mg/dL — ABNORMAL HIGH (ref 0–200)
HDL: 60 mg/dL (ref >40)
LDL Cholesterol: 156 mg/dL — ABNORMAL HIGH (ref 0–99)
Total CHOL/HDL Ratio: 3.8 ratio
Triglycerides: 59 mg/dL (ref <150)
VLDL: 12 mg/dL (ref 0–40)

## 2023-07-03 LAB — IRON AND TIBC
Iron: 36 ug/dL (ref 28–170)
Saturation Ratios: 10 % — ABNORMAL LOW (ref 10.4–31.8)
TIBC: 354 ug/dL (ref 250–450)
UIBC: 318 ug/dL

## 2023-07-03 LAB — VITAMIN B12: Vitamin B-12: 238 pg/mL (ref 180–914)

## 2023-07-03 LAB — TROPONIN I (HIGH SENSITIVITY)
Troponin I (High Sensitivity): 95 ng/L — ABNORMAL HIGH (ref <18)
Troponin I (High Sensitivity): 338 ng/L (ref <18)

## 2023-07-03 LAB — FOLATE: Folate: 38 ng/mL (ref >5.9)

## 2023-07-03 LAB — FERRITIN: Ferritin: 91 ng/mL (ref 11–307)

## 2023-07-03 LAB — HEMOGLOBIN A1C
Hgb A1c MFr Bld: 5.2 % (ref 4.8–5.6)
Mean Plasma Glucose: 102.54 mg/dL

## 2023-07-03 MED ORDER — STROKE: EARLY STAGES OF RECOVERY BOOK: Freq: Once | Status: DC

## 2023-07-03 MED ORDER — ACETAMINOPHEN 160 MG/5ML PO SOLN: 650.0000 mg | ORAL | Status: DC | PRN

## 2023-07-03 MED ORDER — AMLODIPINE BESYLATE 5 MG PO TABS
5.0000 mg | ORAL_TABLET | Freq: Every day | ORAL | Status: DC
  Administered 2023-07-03 (×2): 5 mg via ORAL
  Filled 2023-07-03 (×2): qty 1

## 2023-07-03 MED ORDER — LOSARTAN POTASSIUM 50 MG PO TABS
25.0000 mg | ORAL_TABLET | Freq: Every day | ORAL | Status: DC
  Administered 2023-07-03: 25 mg via ORAL
  Filled 2023-07-03: qty 1

## 2023-07-03 MED ORDER — ACETAMINOPHEN 325 MG PO TABS: 650.0000 mg | ORAL_TABLET | ORAL | Status: DC | PRN

## 2023-07-03 MED ORDER — MAGNESIUM OXIDE -MG SUPPLEMENT 400 (240 MG) MG PO TABS
200.0000 mg | ORAL_TABLET | Freq: Every day | ORAL | Status: DC
  Administered 2023-07-03: 200 mg via ORAL
  Filled 2023-07-03: qty 1

## 2023-07-03 MED ORDER — PROPRANOLOL HCL 20 MG PO TABS
10.0000 mg | ORAL_TABLET | Freq: Two times a day (BID) | ORAL | Status: DC
  Administered 2023-07-03: 10 mg via ORAL
  Filled 2023-07-03: qty 1

## 2023-07-03 MED ORDER — FOLIC ACID 1 MG PO TABS: 500.0000 ug | ORAL_TABLET | Freq: Every day | ORAL | Status: DC

## 2023-07-03 MED ORDER — ASPIRIN 81 MG PO TBEC
81.0000 mg | DELAYED_RELEASE_TABLET | Freq: Every day | ORAL | Status: DC
  Administered 2023-07-03: 81 mg via ORAL
  Filled 2023-07-03: qty 1

## 2023-07-03 MED ORDER — ATORVASTATIN CALCIUM 40 MG PO TABS: 40.0000 mg | ORAL_TABLET | Freq: Every day | ORAL | 1 refills | Status: AC

## 2023-07-03 MED ORDER — FE FUM-VIT C-VIT B12-FA 460-60-0.01-1 MG PO CAPS
1.0000 | ORAL_CAPSULE | Freq: Every day | ORAL | Status: DC
  Filled 2023-07-03: qty 1

## 2023-07-03 MED ORDER — VITAMIN B-12 1000 MCG PO TABS: 1000.0000 ug | ORAL_TABLET | Freq: Every day | ORAL | 1 refills | Status: AC

## 2023-07-03 MED ORDER — MAGNESIUM 250 MG PO TABS: 250.0000 mg | ORAL_TABLET | Freq: Every day | ORAL | Status: DC

## 2023-07-03 MED ORDER — LOSARTAN POTASSIUM 25 MG PO TABS: 25.0000 mg | ORAL_TABLET | Freq: Every day | ORAL | 1 refills | Status: AC

## 2023-07-03 MED ORDER — ATORVASTATIN CALCIUM 20 MG PO TABS
40.0000 mg | ORAL_TABLET | Freq: Every day | ORAL | Status: DC
  Administered 2023-07-03: 40 mg via ORAL
  Filled 2023-07-03: qty 2

## 2023-07-03 MED ORDER — PANTOPRAZOLE SODIUM 40 MG PO TBEC
40.0000 mg | DELAYED_RELEASE_TABLET | Freq: Every day | ORAL | Status: DC
  Administered 2023-07-03: 40 mg via ORAL
  Filled 2023-07-03: qty 1

## 2023-07-03 MED ORDER — ACETAMINOPHEN 650 MG RE SUPP: 650.0000 mg | RECTAL | Status: DC | PRN

## 2023-07-03 MED ORDER — ENOXAPARIN SODIUM 30 MG/0.3ML IJ SOSY
30.0000 mg | PREFILLED_SYRINGE | INTRAMUSCULAR | Status: DC
  Administered 2023-07-03: 30 mg via SUBCUTANEOUS
  Filled 2023-07-03: qty 0.3

## 2023-07-03 NOTE — Progress Notes (Signed)
 SLP Cancellation Note  Patient Details Name: Carrie Rodriguez MRN: [id number] DOB: 1933-12-03   Cancelled treatment:       Reason Eval/Treat Not Completed: SLP screened, no needs identified, will sign off. Chart review completed. MRI negative for acute injury. Pt denied current cognitive linguistic concerns. Attention, language, orientation, and safety awareness all grossly intact during screen. No further speech therapy intervention indicated.   [place] Mabel Roll Clapp, MS, CCC-SLP Speech Language Pathologist Rehab Services; [facility] [phone] (ascom)    [place] J Clapp 07/03/2023, 12:06 PM

## 2023-07-03 NOTE — Progress Notes (Signed)
  Echocardiogram 2D Echocardiogram has been performed.  Carrie Rodriguez 07/03/2023, 3:52 PM

## 2023-07-03 NOTE — H&P (Signed)
 History and Physical    Patient: Carrie Rodriguez:[id number] DOB: 11/18/1933 DOA: 07/02/2023 DOS: the patient was seen and examined on 07/03/2023 PCP: Yehuda Helms, MD  Patient coming from: Home  Chief Complaint:  Chief Complaint  Patient presents with   Dizziness    HPI: Carrie Rodriguez is a 89 y.o. female with medical history significant for Paroxysmal A-fib, CKD 3A, GERD, pulmonary nodules, HTN, being admitted with hypertensive encephalopathy after presenting as a code stroke.  His symptoms consisted of disequilibrium and distortion in vision of the left eye that started several days prior with improvement in symptoms during workup in the ED. On arrival to the ED: BP 215/79 with otherwise normal vitals Head CT per code stroke protocol with no acute findings Patient was seen in consultation by telemetry neurology.  She was not deemed a tPA candidate due to last known well being several days prior and outside of 4.5-hour window. Additional workup with labs notable for hemoglobin of 10.4, Creatinine 1.29, EtOH less than 15, troponin 33 and sed rate 26  EKG, personally reviewed and interpreted showing sinus at 75 with a few PVCs  Code stroke was called off Patient was administered 81 mg chewable aspirin  Administered labetalol 5 mg IV for BP control per neurology recommendations for treatment of hypertensive encephalopathy  Hospitalist consulted for admission.     Review of Systems: As mentioned in the history of present illness. All other systems reviewed and are negative.  Past Medical History:  Diagnosis Date   AF (atrial fibrillation) (HCC)    Cancer (HCC)    Dysrhythmia    A FIB   GERD (gastroesophageal reflux disease)    HOH (hard of hearing)    PAF (paroxysmal atrial fibrillation) (HCC)    Palpitations    Pulmonary nodules    Past Surgical History:  Procedure Laterality Date   ABDOMINAL HYSTERECTOMY     CATARACT EXTRACTION W/PHACO Right 01/13/2018    Procedure: CATARACT EXTRACTION PHACO AND INTRAOCULAR LENS PLACEMENT (IOC);  Surgeon: Clair Crews, MD;  Location: [facility] ORS;  Service: Ophthalmology;  Laterality: Right;  US   01:01 CDE 8.84 Fluid pack lot # [id number]   CATARACT EXTRACTION W/PHACO Left 02/17/2018   Procedure: CATARACT EXTRACTION PHACO AND INTRAOCULAR LENS PLACEMENT (IOC) LEFT;  Surgeon: Clair Crews, MD;  Location: [facility] ORS;  Service: Ophthalmology;  Laterality: Left;  US  00:36.5 CDE 5.01 Fluid pack lot # [id number]   COLONOSCOPY WITH PROPOFOL  N/A 08/30/2019   Procedure: COLONOSCOPY WITH PROPOFOL ;  Surgeon: Selena Daily, MD;  Location: [facility] ENDOSCOPY;  Service: Gastroenterology;  Laterality: N/A;   ESOPHAGOGASTRODUODENOSCOPY (EGD) WITH PROPOFOL  N/A 07/11/2017   Procedure: ESOPHAGOGASTRODUODENOSCOPY (EGD) WITH PROPOFOL ;  Surgeon: Deveron Fly, MD;  Location: [facility] ENDOSCOPY;  Service: Endoscopy;  Laterality: N/A;   ESOPHAGOGASTRODUODENOSCOPY (EGD) WITH PROPOFOL  N/A 08/30/2019   Procedure: ESOPHAGOGASTRODUODENOSCOPY (EGD) WITH PROPOFOL ;  Surgeon: Selena Daily, MD;  Location: [facility] ENDOSCOPY;  Service: Gastroenterology;  Laterality: N/A;   Social History:  reports that she has never smoked. She has never used smokeless tobacco. She reports that she does not currently use alcohol. She reports that she does not currently use drugs.  No Known Allergies  Family History  Problem Relation Age of Onset   Breast cancer Neg Hx     Prior to Admission medications   Medication Sig Start Date End Date Taking? Authorizing Provider  acetaminophen  (TYLENOL ) 500 MG tablet Take 1,000 mg by mouth daily as needed (arthritis pain).    [provider]  aspirin  EC 81 MG tablet Take 81 mg by mouth daily at 2 PM.     [provider]  Biotin 5 MG CAPS Take 5 mg by mouth daily.    [provider]  Ergocalciferol (VITAMIN D2) 10 MCG (400 UNIT) TABS Take 400 Units by mouth daily.    [provider]   folic acid  (FOLVITE ) 400 MCG tablet Take 400 mcg by mouth daily.    [provider]  Magnesium 250 MG TABS Take 250 mg by mouth daily.    [provider]  Multiple Vitamin (MULTIVITAMIN WITH MINERALS) TABS tablet Take 1 tablet by mouth daily.    [provider]  propranolol (INDERAL) 10 MG tablet Take by mouth. 06/14/19 06/13/20  [provider]  RABEprazole (ACIPHEX) 20 MG tablet Take 20 mg by mouth 2 (two) times daily. 08/21/19   [provider]    Physical Exam: Vitals:   07/02/23 2330 07/02/23 2350 07/02/23 2355 07/03/23 0000  BP: (!) 181/66 (!) 168/68 (!) 166/67 (!) 164/61  Pulse: 65 63 63 65  Resp: 13 14 14 13   Temp:      TempSrc:      SpO2: 95% 98% 98% 98%  Weight:      Height:       Physical Exam  Labs on Admission: I have personally reviewed following labs and imaging studies  CBC: Recent Labs  Lab 07/02/23 2225  WBC 7.1  NEUTROABS 2.6  HGB 10.4*  HCT 32.2*  MCV 90.7  PLT 206   Basic Metabolic Panel: Recent Labs  Lab 07/02/23 2225  NA 137  K 4.2  CL 103  CO2 24  GLUCOSE 93  BUN 25*  CREATININE 1.29*  CALCIUM  9.5   GFR: Estimated Creatinine Clearance: 26.5 mL/min (A) (by C-G formula based on SCr of 1.29 mg/dL (H)). Liver Function Tests: Recent Labs  Lab 07/02/23 2225  AST 19  ALT 11  ALKPHOS 64  BILITOT 0.5  PROT 6.8  ALBUMIN 4.1   No results for input(s): "LIPASE", "AMYLASE" in the last 168 hours. No results for input(s): "AMMONIA" in the last 168 hours. Coagulation Profile: Recent Labs  Lab 07/02/23 2225  INR 1.0   Cardiac Enzymes: No results for input(s): "CKTOTAL", "CKMB", "CKMBINDEX", "TROPONINI" in the last 168 hours. BNP (last 3 results) No results for input(s): "PROBNP" in the last 8760 hours. HbA1C: No results for input(s): "HGBA1C" in the last 72 hours. CBG: Recent Labs  Lab 07/02/23 2211  GLUCAP 88   Lipid Profile: No results for input(s): "CHOL", "HDL", "LDLCALC", "TRIG",  "CHOLHDL", "LDLDIRECT" in the last 72 hours. Thyroid  Function Tests: No results for input(s): "TSH", "T4TOTAL", "FREET4", "T3FREE", "THYROIDAB" in the last 72 hours. Anemia Panel: No results for input(s): "VITAMINB12", "FOLATE", "FERRITIN", "TIBC", "IRON", "RETICCTPCT" in the last 72 hours. Urine analysis:    Component Value Date/Time   COLORURINE YELLOW (A) 11/21/2017 0731   APPEARANCEUR Clear 10/12/2019 0844   LABSPEC 1.014 11/21/2017 0731   PHURINE 6.0 11/21/2017 0731   GLUCOSEU Negative 10/12/2019 0844   HGBUR SMALL (A) 11/21/2017 0731   BILIRUBINUR Negative 10/12/2019 0844   KETONESUR 5 (A) 11/21/2017 0731   PROTEINUR Negative 10/12/2019 0844   PROTEINUR NEGATIVE 11/21/2017 0731   NITRITE Negative 10/12/2019 0844   NITRITE NEGATIVE 11/21/2017 0731   LEUKOCYTESUR Negative 10/12/2019 0844    Radiological Exams on Admission: CT HEAD CODE STROKE WO CONTRAST Result Date: 07/02/2023 CLINICAL DATA:  Code stroke. Initial evaluation for neuro  deficit, stroke suspected. EXAM: CT HEAD WITHOUT CONTRAST TECHNIQUE: Contiguous axial images were obtained from the base of the skull through the vertex without intravenous contrast. RADIATION DOSE REDUCTION: This exam was performed according to the departmental dose-optimization program which includes automated exposure control, adjustment of the mA and/or kV according to patient size and/or use of iterative reconstruction technique. COMPARISON:  Prior study from 11/21/2017 FINDINGS: Brain: Cerebral volume within normal limits. No acute intracranial hemorrhage. No acute large vessel territory infarct. No mass lesion or midline shift. No hydrocephalus or extra-axial fluid collection. Vascular: No abnormal hyperdense vessel. Skull: Scalp soft tissues within normal limits for age calvarium intact. Sinuses/Orbits: Globes orbital soft tissues demonstrate no acute finding. Paranasal sinuses and mastoid air cells are largely clear. Other: None. ASPECTS [facility]  Stroke Program Early CT Score) - Ganglionic level infarction (caudate, lentiform nuclei, internal capsule, insula, M1-M3 cortex): 7 - Supraganglionic infarction (M4-M6 cortex): 3 Total score (0-10 with 10 being normal): 10 IMPRESSION: 1. Negative head CT.  No acute intracranial abnormality 2. ASPECTS is 10. Results were called by telephone at the time of interpretation on 07/02/2023 at 10:28 pm to provider Dr. Hendrick Locke, who verbally acknowledged these results. Electronically Signed   By: Virgia Griffins M.D.   On: 07/02/2023 22:29   Data Reviewed for HPI: Relevant notes from primary care and specialist visits, past discharge summaries as available in EHR, including Care Everywhere. Prior diagnostic testing as pertinent to current admission diagnoses Updated medications and problem lists for reconciliation ED course, including vitals, labs, imaging, treatment and response to treatment Triage notes, nursing and pharmacy notes and ED provider's notes Notable results as noted above in HPI      Assessment and Plan: Focal neurological symptom present Acute/subacute CVA versus hypertensive encephalopathy Symptomatic for visual distortion, disequilibrium Recommendations per teleneurology:         Stroke/Telemetry Floor       Neuro Checks (Q4)       Bedside Swallow Eval       DVT Prophylaxis       IV Fluids, Normal Saline       Head of Bed 30 Degrees       Euglycemia and Avoid Hyperthermia (PRN Acetaminophen )  MRI ordered from the ED---> no acute intracranial abnormality Neurology consulted  Hypertensive encephalopathy BP control recommended by neurology to keep systolic under 160 Received IV labetalol in the ED for SBP over 200 with improvement in SBP to 168 Appears to be on propranolol only at home Will start amlodipine 5 mg   GERD (gastroesophageal reflux disease) PPI ordered  Stage 3a chronic kidney disease (HCC) Resume patient at baseline  Anemia Continue folic acid  Will  get an anemia panel  PAF (paroxysmal atrial fibrillation) (HCC) Continue daily aspirin  Not currently on anticoagulation     DVT prophylaxis: Lovenox   Consults: none  Advance Care Planning:   Code Status: Prior   Family Communication: none  Disposition Plan: Back to previous home environment  Severity of Illness: The appropriate patient status for this patient is OBSERVATION. Observation status is judged to be reasonable and necessary in order to provide the required intensity of service to ensure the patient's safety. The patient's presenting symptoms, physical exam findings, and initial radiographic and laboratory data in the context of their medical condition is felt to place them at decreased risk for further clinical deterioration. Furthermore, it is anticipated that the patient will be medically stable for discharge from the hospital within 2 midnights of admission.  Author: Lanetta Pion, MD 07/03/2023 12:32 AM  For on call review [email or web address].

## 2023-07-03 NOTE — Evaluation (Signed)
 Physical Therapy Evaluation Patient Details Name: Carrie Rodriguez MRN: [id number] DOB: 1933-03-27 Today's Date: 07/03/2023  History of Present Illness  Pt is an 89 y.o. female being admitted with hypertensive encephalopathy after presenting as a code stroke, symptoms of disequilibrium and distortion in L eye vision that started several days prior with improvement in symptoms during workup in the ED. MRI head negative. PMH of paroxysmal A-fib, CKD 3A, GERD, pulmonary nodules, HTN.   Clinical Impression  Patient A&Ox4, reported ongoing L eye vision changes but improved from admission. At baseline the pt reported she is independent, lives alone, and works out 5 days a week. She stated that her balance isn't great normally, but DGI score of 22 indicated low fall risk. She was able to perform all mobility modI, don/doff shoes independently. Smooth pursuit, peripheral vision WFLs and pt denied blurry vision or feelings of imbalance with mobility. The patient demonstrated and reported near return to baseline level of functioning, no further acute PT needs indicated. PT to sign off. Please reconsult PT if pt status changes or acute needs are identified.          If plan is discharge home, recommend the following: Other (comment) (NA)   Can travel by private vehicle    NA    Equipment Recommendations None recommended by PT  Recommendations for Other Services       Functional Status Assessment Patient has not had a recent decline in their functional status     Precautions / Restrictions Precautions Precautions: Fall Recall of Precautions/Restrictions: Intact Restrictions Weight Bearing Restrictions Per Provider Order: No      Mobility  Bed Mobility Overal bed mobility: Modified Independent                  Transfers Overall transfer level: Modified independent                      Ambulation/Gait Ambulation/Gait assistance: Modified independent (Device/Increase  time) Gait Distance (Feet): 250 Feet Assistive device: None            Stairs            Wheelchair Mobility     Tilt Bed    Modified Rankin (Stroke Patients Only)       Balance Overall balance assessment: Modified Independent, Needs assistance                               Standardized Balance Assessment Standardized Balance Assessment : Dynamic Gait Index   Dynamic Gait Index Level Surface: Normal Change in Gait Speed: Normal Gait with Horizontal Head Turns: Normal Gait with Vertical Head Turns: Normal Gait and Pivot Turn: Normal Step Over Obstacle: Mild Impairment Step Around Obstacles: Normal Steps: Mild Impairment (clinical judgement) Total Score: 22       Pertinent Vitals/Pain Pain Assessment Pain Assessment: No/denies pain    Home Living Family/patient expects to be discharged to:: Private residence Living Arrangements: Alone Available Help at Discharge: Family;Available PRN/intermittently Type of Home: House Home Access: Stairs to enter Entrance Stairs-Rails: Left Entrance Stairs-Number of Steps: 15   Home Layout: Other (Comment) (enter home in garage, ascend 15 steps to main living including bed/bath) Home Equipment: None      Prior Function Prior Level of Function : Independent/Modified Independent;Driving             Mobility Comments: per reported working out 5 days a week  Extremity/Trunk Assessment   Upper Extremity Assessment Upper Extremity Assessment: Overall WFL for tasks assessed    Lower Extremity Assessment Lower Extremity Assessment: Overall WFL for tasks assessed    Cervical / Trunk Assessment Cervical / Trunk Assessment: Normal  Communication        Cognition Arousal: Alert Behavior During Therapy: WFL for tasks assessed/performed   PT - Cognitive impairments: No apparent impairments                                 Cueing       General Comments      Exercises      Assessment/Plan    PT Assessment Patient does not need any further PT services  PT Problem List         PT Treatment Interventions      PT Goals (Current goals can be found in the Care Plan section)       Frequency       Co-evaluation               AM-PAC PT "6 Clicks" Mobility  Outcome Measure Help needed turning from your back to your side while in a flat bed without using bedrails?: None Help needed moving from lying on your back to sitting on the side of a flat bed without using bedrails?: None Help needed moving to and from a bed to a chair (including a wheelchair)?: None Help needed standing up from a chair using your arms (e.g., wheelchair or bedside chair)?: None Help needed to walk in hospital room?: None Help needed climbing 3-5 steps with a railing? : None 6 Click Score: 24    End of Session Equipment Utilized During Treatment: Gait belt Activity Tolerance: Patient tolerated treatment well Patient left: in bed;with call bell/phone within reach;with family/visitor present;with bed alarm set   PT Visit Diagnosis: Other abnormalities of gait and mobility (R26.89)    Time: 8657-8469 PT Time Calculation (min) (ACUTE ONLY): 18 min   Charges:   PT Evaluation $PT Eval Low Complexity: 1 Low PT Treatments $Therapeutic Activity: 8-22 mins PT General Charges $$ ACUTE PT VISIT: 1 Visit       Darien Eden PT, DPT 10:24 AM,07/03/23

## 2023-07-03 NOTE — Assessment & Plan Note (Addendum)
 Acute/subacute CVA versus hypertensive encephalopathy Symptomatic for visual distortion, disequilibrium Recommendations per teleneurology:         Stroke/Telemetry Floor       Neuro Checks (Q4)       Bedside Swallow Eval       DVT Prophylaxis       IV Fluids, Normal Saline       Head of Bed 30 Degrees       Euglycemia and Avoid Hyperthermia (PRN Acetaminophen )  MRI ordered from the ED---> no acute intracranial abnormality Neurology consulted

## 2023-07-03 NOTE — Progress Notes (Signed)
 OT Cancellation Note  Patient Details Name: Carrie Rodriguez MRN: [id number] DOB: 1933-05-25   Cancelled Treatment:    Reason Eval/Treat Not Completed: OT screened, no needs identified, will sign off. Order received, chart reviewed and per PT pt does not need acute services. OT spoke with pt who agreed she does not have any acute OT needs and is ready to return home. OT to sign off in house.  Ellard Nan E Donnis Phaneuf 07/03/2023, 11:20 AM

## 2023-07-03 NOTE — Assessment & Plan Note (Addendum)
 BP control recommended by neurology to keep systolic under 160 Received IV labetalol in the ED for SBP over 200 with improvement in SBP to 168 Appears to be on propranolol only at home Will start amlodipine 5 mg

## 2023-07-03 NOTE — Hospital Course (Addendum)
 Taken from H&P.  Carrie Rodriguez is a 89 y.o. female with medical history significant for Paroxysmal A-fib, CKD 3A, GERD, pulmonary nodules, HTN, being admitted with hypertensive encephalopathy after presenting as a code stroke.  His symptoms consisted of disequilibrium and distortion in vision of the left eye that started several days prior with improvement in symptoms during workup in the ED.   On arrival elevated blood pressure at 215/79.  CT head was negative for any acute abnormality.  Labs notable for hemoglobin of 10.4, creatinine 1.29, troponin 33 and ESR 26. EKG with NSR and few PVCs.  Code stroke was called off.  Patient was admitted for hypertensive encephalopathy.  5/22: Blood pressure with some improvement to 164/64.  Passed swallow evaluation and started on diet.  UDS negative, troponin increased to 95>>338, anemia panel with B12 of 238, normal iron levels with low saturation.  Starting on supplement.  MRI brain was negative for any acute intracranial abnormality. Patient was just taking low-dose propranolol at home-started on losartan.  Patient with no chest pain and cardiology was consulted.  They will follow-up as outpatient likely due to demand ischemia with significantly elevated blood pressure.  Lipitor was also added.  Echocardiogram was normal.  Patient will continue on current medications and need to have a close follow-up with her providers for further assistance.

## 2023-07-03 NOTE — ED Notes (Signed)
Pt returns from radiology. 

## 2023-07-03 NOTE — Assessment & Plan Note (Signed)
 Continue daily aspirin  Not currently on anticoagulation

## 2023-07-03 NOTE — Assessment & Plan Note (Signed)
 Resume patient at baseline

## 2023-07-03 NOTE — ED Notes (Signed)
 Pt to radiology.

## 2023-07-03 NOTE — Assessment & Plan Note (Signed)
 Continue folic acid  Will get an anemia panel

## 2023-07-03 NOTE — Discharge Summary (Signed)
 Physician Discharge Summary   Patient: Carrie Rodriguez MRN: [id number] DOB: [birth year]  Admit date:     07/02/2023  Discharge date: 07/03/23  Discharge Physician: Luna Salinas   PCP: Yehuda Helms, MD   Recommendations at discharge:   Follow up with primary Rodriguez provider. Follow up with Cardiology  Discharge Diagnoses: Active Problems:   Focal neurological symptom present   Hypertensive encephalopathy   PAF (paroxysmal atrial fibrillation) (HCC)   Pure hypercholesterolemia   Anemia   Stage 3a chronic kidney disease (HCC)   GERD (gastroesophageal reflux disease)   Hospital Course: Taken from H&P.  Carrie Rodriguez is a 89 y.o. female with medical history significant for Paroxysmal A-fib, CKD 3A, GERD, pulmonary nodules, HTN, being admitted with hypertensive encephalopathy after presenting as a code stroke.  His symptoms consisted of disequilibrium and distortion in vision of the left eye that started several days prior with improvement in symptoms during workup in the ED.   On arrival elevated blood pressure at 215/79.  CT head was negative for any acute abnormality.  Labs notable for hemoglobin of 10.4, creatinine 1.29, troponin 33 and ESR 26. EKG with NSR and few PVCs.  Code stroke was called off.  Patient was admitted for hypertensive encephalopathy.  5/22: Blood pressure with some improvement to 164/64.  Passed swallow evaluation and started on diet.  UDS negative, troponin increased to 95>>338, anemia panel with B12 of 238, normal iron levels with low saturation.  Starting on supplement.  MRI brain was negative for any acute intracranial abnormality. Patient was just taking low-dose propranolol at home-started on losartan.  Patient with no chest pain and cardiology was consulted.  They will follow-up as outpatient likely due to demand ischemia with significantly elevated blood pressure.  Lipitor was also added.  Echocardiogram was normal.  Patient will continue on current  medications and need to have a close follow-up with her providers for further assistance.   Consultants: Cardiology Procedures performed: None  Disposition: Home Diet recommendation:  Discharge Diet Orders (From admission, onward)     Start     Ordered   07/03/23 0000  Diet - low sodium heart healthy        07/03/23 1800           Cardiac and Carb modified diet DISCHARGE MEDICATION: Allergies as of 07/03/2023   No Known Allergies      Medication List     STOP taking these medications    Biotin 5 MG Caps       TAKE these medications    acetaminophen  500 MG tablet Commonly known as: TYLENOL  Take 1,000 mg by mouth daily as needed (arthritis pain).   aspirin  EC 81 MG tablet Take 81 mg by mouth daily at 2 PM.   atorvastatin  40 MG tablet Commonly known as: LIPITOR Take 1 tablet (40 mg total) by mouth daily. Start taking on: Jul 04, 2023   cyanocobalamin 1000 MCG tablet Commonly known as: VITAMIN B12 Take 1 tablet (1,000 mcg total) by mouth daily.   ferrous sulfate 325 (65 FE) MG EC tablet Take 325 mg by mouth daily with breakfast.   folic acid  400 MCG tablet Commonly known as: FOLVITE  Take 400 mcg by mouth daily.   losartan 25 MG tablet Commonly known as: COZAAR Take 1 tablet (25 mg total) by mouth daily.   Magnesium 250 MG Tabs Take 250 mg by mouth daily.   multivitamin with minerals Tabs tablet Take 1 tablet by mouth daily.  propranolol 10 MG tablet Commonly known as: INDERAL Take by mouth.   RABEprazole 20 MG tablet Commonly known as: ACIPHEX Take 20 mg by mouth 2 (two) times daily.   senna-docusate 8.6-50 MG tablet Commonly known as: Senokot-S Take 2 tablets by mouth daily.   Vitamin D2 10 MCG (400 UNIT) Tabs Take 400 Units by mouth daily.        Follow-up Information     Custovic, Lanell Pinta, DO. Go in 1 week(s).   Specialty: Cardiology Contact information: [address] [place] [address] [phone]          Yehuda Helms, MD. Schedule an appointment as soon as possible for a visit in 1 week(s).   Specialty: Internal Medicine Contact information: [address] [place] [address] [phone]                Discharge Exam: Carrie Rodriguez Weights   07/02/23 2224  Weight: 55.8 kg   General. Frail elderly lady, In no acute distress. Pulmonary.  Lungs clear bilaterally, normal respiratory effort. CV.  Regular rate and rhythm, no JVD, rub or murmur. Abdomen.  Soft, nontender, nondistended, BS positive. CNS.  Alert and oriented .  No focal neurologic deficit. Extremities.  No edema, no cyanosis, pulses intact and symmetrical. Psychiatry.  Judgment and insight appears normal.   Condition at discharge: stable  The results of significant diagnostics from this hospitalization (including imaging, microbiology, ancillary and laboratory) are listed below for reference.   Imaging Studies: ECHOCARDIOGRAM COMPLETE Result Date: 07/03/2023    ECHOCARDIOGRAM REPORT   Patient Name:   Carrie Rodriguez Date of Exam: 07/03/2023 Medical Rec #:  [id number]       Height:       64.0 in Accession #:    [id number]      Weight:       123.0 lb Date of Birth:  [birth year]       BSA:          1.591 m Patient Age:    89 years        BP:           150/60 mmHg Patient Gender: F               HR:           73 bpm. Exam Location:  [facility] Procedure: 2D Echo (Both Spectral and Color Flow Doppler were utilized during            procedure). Indications:     stroke  History:         Patient has prior history of Echocardiogram examinations, most                  recent 11/21/2017. Chronic kidney disease,                  Arrythmias:paroxysmal a-fib; Risk Factors:Hypertension and                  Dyslipidemia.  Sonographer:     Dione Franks RDCS Referring Phys:  [id number] Lanetta Pion Diagnosing Phys: Constancia Delton MD IMPRESSIONS  1. Left ventricular ejection fraction, by estimation, is 60 to 65%. The  left ventricle has normal function. The left ventricle has no regional wall motion abnormalities. Left ventricular diastolic parameters were normal.  2. Right ventricular systolic function is normal. The right ventricular size is normal. There is normal pulmonary artery systolic pressure.  3. The mitral valve is  normal in structure. Mild mitral valve regurgitation.  4. The aortic valve is tricuspid. Aortic valve regurgitation is not visualized.  5. The inferior vena cava is normal in size with greater than 50% respiratory variability, suggesting right atrial pressure of 3 mmHg. FINDINGS  Left Ventricle: Left ventricular ejection fraction, by estimation, is 60 to 65%. The left ventricle has normal function. The left ventricle has no regional wall motion abnormalities. The left ventricular internal cavity size was normal in size. There is  no left ventricular hypertrophy. Left ventricular diastolic parameters were normal. Right Ventricle: The right ventricular size is normal. No increase in right ventricular wall thickness. Right ventricular systolic function is normal. There is normal pulmonary artery systolic pressure. The tricuspid regurgitant velocity is 2.25 m/s, and  with an assumed right atrial pressure of 3 mmHg, the estimated right ventricular systolic pressure is 23.2 mmHg. Left Atrium: Left atrial size was normal in size. Right Atrium: Right atrial size was normal in size. Pericardium: There is no evidence of pericardial effusion. Mitral Valve: The mitral valve is normal in structure. Mild mitral valve regurgitation. Tricuspid Valve: The tricuspid valve is normal in structure. Tricuspid valve regurgitation is mild. Aortic Valve: The aortic valve is tricuspid. Aortic valve regurgitation is not visualized. Pulmonic Valve: The pulmonic valve was normal in structure. Pulmonic valve regurgitation is mild. Aorta: The aortic root is normal in size and structure. Venous: The inferior vena cava is normal in size  with greater than 50% respiratory variability, suggesting right atrial pressure of 3 mmHg. IAS/Shunts: No atrial level shunt detected by color flow Doppler.  LEFT VENTRICLE PLAX 2D LVIDd:         4.10 cm   Diastology LVIDs:         2.60 cm   LV e' medial:    7.40 cm/s LV PW:         0.80 cm   LV E/e' medial:  14.1 LV IVS:        0.80 cm   LV e' lateral:   10.30 cm/s LVOT diam:     1.70 cm   LV E/e' lateral: 10.1 LV SV:         52 LV SV Index:   33 LVOT Area:     2.27 cm  RIGHT VENTRICLE             IVC RV Basal diam:  2.30 cm     IVC diam: 1.00 cm RV S prime:     15.00 cm/s TAPSE (M-mode): 2.1 cm LEFT ATRIUM             Index        RIGHT ATRIUM          Index LA diam:        3.00 cm 1.89 cm/m   RA Area:     9.78 cm LA Vol (A2C):   29.1 ml 18.29 ml/m  RA Volume:   20.70 ml 13.01 ml/m LA Vol (A4C):   34.8 ml 21.87 ml/m LA Biplane Vol: 33.3 ml 20.93 ml/m  AORTIC VALVE LVOT Vmax:   95.30 cm/s LVOT Vmean:  64.400 cm/s LVOT VTI:    0.228 m  AORTA Ao Root diam: 2.90 cm Ao Asc diam:  3.20 cm MITRAL VALVE                TRICUSPID VALVE MV Area (PHT): 3.85 cm     TR Peak grad:   20.2 mmHg MV Decel Time: 197 msec  TR Vmax:        225.00 cm/s MV E velocity: 104.00 cm/s MV A velocity: 108.00 cm/s  SHUNTS MV E/A ratio:  0.96         Systemic VTI:  0.23 m                             Systemic Diam: 1.70 cm Constancia Delton MD Electronically signed by Constancia Delton MD Signature Date/Time: 07/03/2023/4:46:01 PM    Final    MR BRAIN WO CONTRAST Result Date: 07/03/2023 CLINICAL DATA:  Initial evaluation for acute headache, dizziness. EXAM: MRI HEAD WITHOUT CONTRAST TECHNIQUE: Multiplanar, multiecho pulse sequences of the brain and surrounding structures were obtained without intravenous contrast. COMPARISON:  CT from 07/02/2023 FINDINGS: Brain: Cerebral volume within normal limits. Patchy T2/FLAIR hyperintensity involving the supratentorial cerebral white matter, most characteristic of chronic microvascular ischemic  disease, minor for age. No evidence for acute or subacute infarct. No areas of chronic cortical infarction. No acute or chronic intracranial blood products. No mass lesion, midline shift or mass effect no hydrocephalus or extra-axial fluid collection. Pituitary gland within normal limits. Vascular: Major intracranial vascular flow voids are maintained. Skull and upper cervical spine: Cranial junction within normal limits. Bone marrow signal intensity within normal limits. No scalp soft tissue abnormality. Apparent focus of FLAIR signal intensity along the midline at the high anterior scalp noted, felt to be consistent with artifact. Sinuses/Orbits: Prior bilateral ocular lens replacement. Paranasal sinuses are largely clear. No significant mastoid effusion. Other: None. IMPRESSION: 1. No acute intracranial abnormality. 2. Mild chronic microvascular ischemic disease for age. Electronically Signed   By: Virgia Griffins M.D.   On: 07/03/2023 01:37   CT HEAD CODE STROKE WO CONTRAST Result Date: 07/02/2023 CLINICAL DATA:  Code stroke. Initial evaluation for neuro deficit, stroke suspected. EXAM: CT HEAD WITHOUT CONTRAST TECHNIQUE: Contiguous axial images were obtained from the base of the skull through the vertex without intravenous contrast. RADIATION DOSE REDUCTION: This exam was performed according to the departmental dose-optimization program which includes automated exposure control, adjustment of the mA and/or kV according to patient size and/or use of iterative reconstruction technique. COMPARISON:  Prior study from 11/21/2017 FINDINGS: Brain: Cerebral volume within normal limits. No acute intracranial hemorrhage. No acute large vessel territory infarct. No mass lesion or midline shift. No hydrocephalus or extra-axial fluid collection. Vascular: No abnormal hyperdense vessel. Skull: Scalp soft tissues within normal limits for age calvarium intact. Sinuses/Orbits: Globes orbital soft tissues demonstrate no  acute finding. Paranasal sinuses and mastoid air cells are largely clear. Other: None. ASPECTS [facility] Stroke Program Early CT Score) - Ganglionic level infarction (caudate, lentiform nuclei, internal capsule, insula, M1-M3 cortex): 7 - Supraganglionic infarction (M4-M6 cortex): 3 Total score (0-10 with 10 being normal): 10 IMPRESSION: 1. Negative head CT.  No acute intracranial abnormality 2. ASPECTS is 10. Results were called by telephone at the time of interpretation on 07/02/2023 at 10:28 pm to provider Dr. Hendrick Locke, who verbally acknowledged these results. Electronically Signed   By: Virgia Griffins M.D.   On: 07/02/2023 22:29    Microbiology: Results for orders placed or performed in visit on 10/12/19  Microscopic Examination     Status: Abnormal   Collection Time: 10/12/19  8:44 AM   Urine  Result Value Ref Range Status   WBC, UA 0-5 0 - 5 /hpf Final   RBC, Urine 3-10 (A) 0 - 2 /hpf Final   Epithelial Cells (  non renal) None seen 0 - 10 /hpf Final   Bacteria, UA None seen None seen/Few Final    Labs: CBC: Recent Labs  Lab 07/02/23 2225  WBC 7.1  NEUTROABS 2.6  HGB 10.4*  HCT 32.2*  MCV 90.7  PLT 206   Basic Metabolic Panel: Recent Labs  Lab 07/02/23 2225  NA 137  K 4.2  CL 103  CO2 24  GLUCOSE 93  BUN 25*  CREATININE 1.29*  CALCIUM  9.5   Liver Function Tests: Recent Labs  Lab 07/02/23 2225  AST 19  ALT 11  ALKPHOS 64  BILITOT 0.5  PROT 6.8  ALBUMIN 4.1   CBG: Recent Labs  Lab 07/02/23 2211  GLUCAP 88    Discharge time spent: greater than 30 minutes.  This record has been created using Conservation officer, historic buildings. Errors have been sought and corrected,but may not always be located. Such creation errors do not reflect on the standard of Rodriguez.   Signed: Luna Salinas, MD [facility] 07/03/2023

## 2023-07-03 NOTE — ED Triage Notes (Signed)
 Pt to ED via [facility] from home c/o weakness and fall. Pt was here for same weakness yesterday. Pt reports weakness has been going on for several weeks and has been getting progressively worse. Pt was going to take shower when she felt weak and fell. Did not hit head, had to crawl to loving room to call 911. Denies CP, SOB fevers, dizziness

## 2023-07-03 NOTE — Progress Notes (Signed)
 Anticoagulation monitoring(Lovenox ):  89 yo  female ordered Lovenox  40 mg Q24h    Filed Weights   07/02/23 2224  Weight: 55.8 kg (123 lb)   BMI 21.1    Lab Results  Component Value Date   CREATININE 1.29 (H) 07/02/2023   CREATININE 0.99 11/20/2017   CREATININE 1.28 (H) 10/04/2014   Estimated Creatinine Clearance: 26.5 mL/min (A) (by C-G formula based on SCr of 1.29 mg/dL (H)). Hemoglobin & Hematocrit     Component Value Date/Time   HGB 10.4 (L) 07/02/2023 2225   HCT 32.2 (L) 07/02/2023 2225     Per Protocol for Patient with estCrcl < 30 ml/min and BMI < 30, will transition to Lovenox  30 mg Q24h.

## 2023-07-03 NOTE — Assessment & Plan Note (Signed)
 PPI ordered

## 2023-07-03 NOTE — ED Notes (Signed)
 Patient ambulated to bedside commode w/o assistance. Denies dizziness.

## 2023-07-03 NOTE — Consult Note (Signed)
 [facility] CLINIC CARDIOLOGY CONSULT NOTE       Patient ID: Carrie Rodriguez MRN: [id number] DOB/AGE: [birth year] 89 y.o.  Admit date: 07/02/2023 Referring Physician Dr. Luna Salinas Primary Physician Sparks, Rosalynn Come, MD Primary Cardiologist [facility] Reason for Consultation Elevated troponins  HPI: Carrie Rodriguez is a 89 y.o. female  with a past medical history of remote history of atrial fibrillation, CKD 3A, HTN, pulmonary nodules who presented to the ED on 07/02/2023 for visual disturbances and elevated SBP > 200. Patient admitted with hypertensive encephalopathy. Troponins were found to be elevated. Cardiology was consulted for further evaluation.   Patient presented to the ED with visual disturbances that patient describes as flashing lights and with SBP > 200. Patient denied any chest pain, palpitations or SOB. Work up in the ED notable for Na 137, k 4.2, Cr 1.29, Hgb 10.4, plts 206. A1C 5.2. UDS and alcohol level negative. MRI and CT head with no acute abnormalities. Trops elevated and trending 33 > 95 > 338. EKG in ED with sinus rhythm, rate 75 bpm with no acute ischemic changes. Lipid panel with cholesterol 228, LDL 156. Patient received 1 dose of IV 5 mg labetalol.   At the time of my evaluation this morning, patient was resting comfortably in hospital bed in no acute distress with sister at bedside. Discussed patients sxs in further detail. Patient denies any chest pain, SOB or palpitations. Patient denies any history of CAD or HF. Patient reports having a stress test in the past and resulted normal. Patient walks 3 miles per day 5x per week and also attends workout classes and never has cardiac sxs. Patient states she took an exercise 2 days ago and had no concerns.   Review of systems complete and found to be negative unless listed above    Past Medical History:  Diagnosis Date   AF (atrial fibrillation) (HCC)    Cancer (HCC)    Dysrhythmia    A FIB   GERD (gastroesophageal  reflux disease)    HOH (hard of hearing)    PAF (paroxysmal atrial fibrillation) (HCC)    Palpitations    Pulmonary nodules     Past Surgical History:  Procedure Laterality Date   ABDOMINAL HYSTERECTOMY     CATARACT EXTRACTION W/PHACO Right 01/13/2018   Procedure: CATARACT EXTRACTION PHACO AND INTRAOCULAR LENS PLACEMENT (IOC);  Surgeon: Clair Crews, MD;  Location: [facility] ORS;  Service: Ophthalmology;  Laterality: Right;  US   01:01 CDE 8.84 Fluid pack lot # [id number]   CATARACT EXTRACTION W/PHACO Left 02/17/2018   Procedure: CATARACT EXTRACTION PHACO AND INTRAOCULAR LENS PLACEMENT (IOC) LEFT;  Surgeon: Clair Crews, MD;  Location: [facility] ORS;  Service: Ophthalmology;  Laterality: Left;  US  00:36.5 CDE 5.01 Fluid pack lot # [id number]   COLONOSCOPY WITH PROPOFOL  N/A 08/30/2019   Procedure: COLONOSCOPY WITH PROPOFOL ;  Surgeon: Selena Daily, MD;  Location: [facility] ENDOSCOPY;  Service: Gastroenterology;  Laterality: N/A;   ESOPHAGOGASTRODUODENOSCOPY (EGD) WITH PROPOFOL  N/A 07/11/2017   Procedure: ESOPHAGOGASTRODUODENOSCOPY (EGD) WITH PROPOFOL ;  Surgeon: Deveron Fly, MD;  Location: [facility] ENDOSCOPY;  Service: Endoscopy;  Laterality: N/A;   ESOPHAGOGASTRODUODENOSCOPY (EGD) WITH PROPOFOL  N/A 08/30/2019   Procedure: ESOPHAGOGASTRODUODENOSCOPY (EGD) WITH PROPOFOL ;  Surgeon: Selena Daily, MD;  Location: [facility] ENDOSCOPY;  Service: Gastroenterology;  Laterality: N/A;    (Not in a hospital admission)  Social History   Socioeconomic History   Marital status: Widowed    Spouse name: Not on file   Number of children: Not on  file   Years of education: Not on file   Highest education level: Not on file  Occupational History   Not on file  Tobacco Use   Smoking status: Never   Smokeless tobacco: Never  Vaping Use   Vaping status: Never Used  Substance and Sexual Activity   Alcohol use: Not Currently    Comment: 1 glass of wine a year    Drug use: Not Currently   Sexual activity:  Yes    Birth control/protection: Surgical  Other Topics Concern   Not on file  Social History Narrative   Not on file   Social Drivers of Health   Financial Resource Strain: Low Risk  (11/26/2022)   Received from [facility]   Overall Financial Resource Strain (CARDIA)    Difficulty of Paying Living Expenses: Not hard at all  Food Insecurity: No Food Insecurity (07/03/2023)   Hunger Vital Sign    Worried About Running Out of Food in the Last Year: Never true    Ran Out of Food in the Last Year: Never true  Transportation Needs: No Transportation Needs (07/03/2023)   PRAPARE - Administrator, Civil Service (Medical): No    Lack of Transportation (Non-Medical): No  Physical Activity: Not on file  Stress: Not on file  Social Connections: Unknown (07/03/2023)   Social Connection and Isolation Panel [NHANES]    Frequency of Communication with Friends and Family: More than three times a week    Frequency of Social Gatherings with Friends and Family: Not on file    Attends Religious Services: Not on file    Active Member of Clubs or Organizations: Not on file    Attends Banker Meetings: Not on file    Marital Status: Widowed  Intimate Partner Violence: Not At Risk (07/03/2023)   Humiliation, Afraid, Rape, and Kick questionnaire    Fear of Current or Ex-Partner: No    Emotionally Abused: No    Physically Abused: No    Sexually Abused: No    Family History  Problem Relation Age of Onset   Breast cancer Neg Hx      Vitals:   07/03/23 1555 07/03/23 1700 07/03/23 1730 07/03/23 1800  BP: (!) 152/57 (!) 140/64 (!) 143/62 (!) 142/55  Pulse: 72 71 73 72  Resp: 12 18 15 17   Temp: 97.6 F (36.4 C)     TempSrc: Oral     SpO2: 100% 99% 100% 98%  Weight:      Height:        PHYSICAL EXAM General: well appearing elderly female, well nourished, in no acute distress. HEENT: Normocephalic and atraumatic. Neck: No JVD.   Lungs: Normal  respiratory effort on room air. Clear bilaterally to auscultation. No wheezes, crackles, rhonchi.  Heart: HRRR. Normal S1 and S2 without gallops or murmurs.  Abdomen: Non-distended appearing.  Msk: Normal strength and tone for age. Extremities: Warm and well perfused. No clubbing, cyanosis. No edema.  Neuro: Alert and oriented X 3. Psych: Answers questions appropriately.   Labs: Basic Metabolic Panel: Recent Labs    07/02/23 2225  NA 137  K 4.2  CL 103  CO2 24  GLUCOSE 93  BUN 25*  CREATININE 1.29*  CALCIUM  9.5   Liver Function Tests: Recent Labs    07/02/23 2225  AST 19  ALT 11  ALKPHOS 64  BILITOT 0.5  PROT 6.8  ALBUMIN 4.1   No results for input(s): "LIPASE", "AMYLASE" in the  last 72 hours. CBC: Recent Labs    07/02/23 2225  WBC 7.1  NEUTROABS 2.6  HGB 10.4*  HCT 32.2*  MCV 90.7  PLT 206   Cardiac Enzymes: Recent Labs    07/02/23 2225 07/03/23 0037 07/03/23 1022  TROPONINIHS 33* 95* 338*   BNP: No results for input(s): "BNP" in the last 72 hours. D-Dimer: No results for input(s): "DDIMER" in the last 72 hours. Hemoglobin A1C: Recent Labs    07/03/23 0037  HGBA1C 5.2   Fasting Lipid Panel: Recent Labs    07/03/23 0506  CHOL 228*  HDL 60  LDLCALC 156*  TRIG 59  CHOLHDL 3.8   Thyroid  Function Tests: No results for input(s): "TSH", "T4TOTAL", "T3FREE", "THYROIDAB" in the last 72 hours.  Invalid input(s): "FREET3" Anemia Panel: Recent Labs    07/03/23 0037  VITAMINB12 238  FOLATE 38.0  FERRITIN 91  TIBC 354  IRON 36  RETICCTPCT 1.2     Radiology: ECHOCARDIOGRAM COMPLETE Result Date: 07/03/2023    ECHOCARDIOGRAM REPORT   Patient Name:   Carrie Rodriguez [facility] Date of Exam: 07/03/2023 Medical Rec #:  [id number]       Height:       64.0 in Accession #:    [id number]      Weight:       123.0 lb Date of Birth:  12-28-1933       BSA:          1.591 m Patient Age:    89 years        BP:           150/60 mmHg Patient Gender: F               HR:            73 bpm. Exam Location:  [facility] Procedure: 2D Echo (Both Spectral and Color Flow Doppler were utilized during            procedure). Indications:     stroke  History:         Patient has prior history of Echocardiogram examinations, most                  recent 11/21/2017. Chronic kidney disease,                  Arrythmias:paroxysmal a-fib; Risk Factors:Hypertension and                  Dyslipidemia.  Sonographer:     Dione Franks RDCS Referring Phys:  [id number] Lanetta Pion Diagnosing Phys: Constancia Delton MD IMPRESSIONS  1. Left ventricular ejection fraction, by estimation, is 60 to 65%. The left ventricle has normal function. The left ventricle has no regional wall motion abnormalities. Left ventricular diastolic parameters were normal.  2. Right ventricular systolic function is normal. The right ventricular size is normal. There is normal pulmonary artery systolic pressure.  3. The mitral valve is normal in structure. Mild mitral valve regurgitation.  4. The aortic valve is tricuspid. Aortic valve regurgitation is not visualized.  5. The inferior vena cava is normal in size with greater than 50% respiratory variability, suggesting right atrial pressure of 3 mmHg. FINDINGS  Left Ventricle: Left ventricular ejection fraction, by estimation, is 60 to 65%. The left ventricle has normal function. The left ventricle has no regional wall motion abnormalities. The left ventricular internal cavity size was normal in size. There is  no left ventricular hypertrophy. Left ventricular diastolic parameters were  normal. Right Ventricle: The right ventricular size is normal. No increase in right ventricular wall thickness. Right ventricular systolic function is normal. There is normal pulmonary artery systolic pressure. The tricuspid regurgitant velocity is 2.25 m/s, and  with an assumed right atrial pressure of 3 mmHg, the estimated right ventricular systolic pressure is 23.2 mmHg. Left Atrium: Left atrial size  was normal in size. Right Atrium: Right atrial size was normal in size. Pericardium: There is no evidence of pericardial effusion. Mitral Valve: The mitral valve is normal in structure. Mild mitral valve regurgitation. Tricuspid Valve: The tricuspid valve is normal in structure. Tricuspid valve regurgitation is mild. Aortic Valve: The aortic valve is tricuspid. Aortic valve regurgitation is not visualized. Pulmonic Valve: The pulmonic valve was normal in structure. Pulmonic valve regurgitation is mild. Aorta: The aortic root is normal in size and structure. Venous: The inferior vena cava is normal in size with greater than 50% respiratory variability, suggesting right atrial pressure of 3 mmHg. IAS/Shunts: No atrial level shunt detected by color flow Doppler.  LEFT VENTRICLE PLAX 2D LVIDd:         4.10 cm   Diastology LVIDs:         2.60 cm   LV e' medial:    7.40 cm/s LV PW:         0.80 cm   LV E/e' medial:  14.1 LV IVS:        0.80 cm   LV e' lateral:   10.30 cm/s LVOT diam:     1.70 cm   LV E/e' lateral: 10.1 LV SV:         52 LV SV Index:   33 LVOT Area:     2.27 cm  RIGHT VENTRICLE             IVC RV Basal diam:  2.30 cm     IVC diam: 1.00 cm RV S prime:     15.00 cm/s TAPSE (M-mode): 2.1 cm LEFT ATRIUM             Index        RIGHT ATRIUM          Index LA diam:        3.00 cm 1.89 cm/m   RA Area:     9.78 cm LA Vol (A2C):   29.1 ml 18.29 ml/m  RA Volume:   20.70 ml 13.01 ml/m LA Vol (A4C):   34.8 ml 21.87 ml/m LA Biplane Vol: 33.3 ml 20.93 ml/m  AORTIC VALVE LVOT Vmax:   95.30 cm/s LVOT Vmean:  64.400 cm/s LVOT VTI:    0.228 m  AORTA Ao Root diam: 2.90 cm Ao Asc diam:  3.20 cm MITRAL VALVE                TRICUSPID VALVE MV Area (PHT): 3.85 cm     TR Peak grad:   20.2 mmHg MV Decel Time: 197 msec     TR Vmax:        225.00 cm/s MV E velocity: 104.00 cm/s MV A velocity: 108.00 cm/s  SHUNTS MV E/A ratio:  0.96         Systemic VTI:  0.23 m                             Systemic Diam: 1.70 cm Constancia Delton MD Electronically signed by Constancia Delton MD Signature Date/Time: 07/03/2023/4:46:01 PM    Final  MR BRAIN WO CONTRAST Result Date: 07/03/2023 CLINICAL DATA:  Initial evaluation for acute headache, dizziness. EXAM: MRI HEAD WITHOUT CONTRAST TECHNIQUE: Multiplanar, multiecho pulse sequences of the brain and surrounding structures were obtained without intravenous contrast. COMPARISON:  CT from 07/02/2023 FINDINGS: Brain: Cerebral volume within normal limits. Patchy T2/FLAIR hyperintensity involving the supratentorial cerebral white matter, most characteristic of chronic microvascular ischemic disease, minor for age. No evidence for acute or subacute infarct. No areas of chronic cortical infarction. No acute or chronic intracranial blood products. No mass lesion, midline shift or mass effect no hydrocephalus or extra-axial fluid collection. Pituitary gland within normal limits. Vascular: Major intracranial vascular flow voids are maintained. Skull and upper cervical spine: Cranial junction within normal limits. Bone marrow signal intensity within normal limits. No scalp soft tissue abnormality. Apparent focus of FLAIR signal intensity along the midline at the high anterior scalp noted, felt to be consistent with artifact. Sinuses/Orbits: Prior bilateral ocular lens replacement. Paranasal sinuses are largely clear. No significant mastoid effusion. Other: None. IMPRESSION: 1. No acute intracranial abnormality. 2. Mild chronic microvascular ischemic disease for age. Electronically Signed   By: Virgia Griffins M.D.   On: 07/03/2023 01:37   CT HEAD CODE STROKE WO CONTRAST Result Date: 07/02/2023 CLINICAL DATA:  Code stroke. Initial evaluation for neuro deficit, stroke suspected. EXAM: CT HEAD WITHOUT CONTRAST TECHNIQUE: Contiguous axial images were obtained from the base of the skull through the vertex without intravenous contrast. RADIATION DOSE REDUCTION: This exam was performed according to  the departmental dose-optimization program which includes automated exposure control, adjustment of the mA and/or kV according to patient size and/or use of iterative reconstruction technique. COMPARISON:  Prior study from 11/21/2017 FINDINGS: Brain: Cerebral volume within normal limits. No acute intracranial hemorrhage. No acute large vessel territory infarct. No mass lesion or midline shift. No hydrocephalus or extra-axial fluid collection. Vascular: No abnormal hyperdense vessel. Skull: Scalp soft tissues within normal limits for age calvarium intact. Sinuses/Orbits: Globes orbital soft tissues demonstrate no acute finding. Paranasal sinuses and mastoid air cells are largely clear. Other: None. ASPECTS [facility] Stroke Program Early CT Score) - Ganglionic level infarction (caudate, lentiform nuclei, internal capsule, insula, M1-M3 cortex): 7 - Supraganglionic infarction (M4-M6 cortex): 3 Total score (0-10 with 10 being normal): 10 IMPRESSION: 1. Negative head CT.  No acute intracranial abnormality 2. ASPECTS is 10. Results were called by telephone at the time of interpretation on 07/02/2023 at 10:28 pm to provider Dr. Hendrick Locke, who verbally acknowledged these results. Electronically Signed   By: Virgia Griffins M.D.   On: 07/02/2023 22:29    ECHO as above  TELEMETRY reviewed by me 07/03/2023: sinus rhythm, rate 70s  EKG reviewed by me: sinus rhythm, rate 75 bpm  Data reviewed by me 07/03/2023: last 24h vitals tele labs imaging I/O ED provider note, admission H&P.  Active Problems:   PAF (paroxysmal atrial fibrillation) (HCC)   Pure hypercholesterolemia   Anemia   Stage 3a chronic kidney disease (HCC)   GERD (gastroesophageal reflux disease)   Focal neurological symptom present   Hypertensive encephalopathy    ASSESSMENT AND PLAN:  Carrie Rodriguez is a 89 y.o. female  with a past medical history of remote history of atrial fibrillation, CKD 3A, HTN, pulmonary nodules who presented to the ED  on 07/02/2023 for visual disturbances and elevated SBP > 200. Patient admitted with hypertensive encephalopathy. Troponins were found to be elevated. Cardiology was consulted for further evaluation.   # Hypertensive Encephalopathy  # Deman Ischemia  #  Hypertension # Hyperlipidemia # CKD stage 3a Patient with no known history of CAD or heart failure presents to ED due to visual disturbances and SBP > 200.  Trops elevated and trending 33 > 95 > 338. EKG in ED with sinus rhythm, rate 75 bpm with no acute ischemic changes. Lipid panel with cholesterol 228, LDL 156. S/p 1 dose of IV 5 mg labetalol SBP improved to 160s. Echo this admission with pEF, no RWMA. -Continue to trend troponins until peaked.  -Continue amlodipine 5 mg daily -Start losartan 25 mg daily -Start atorvastatin  40 mg daily due to elevated LDL.   -Patient has remote history of atrial fibrillation and never placed on anticoagulation. Per chart review there is no sign of atrial fibrillation on EKGs. Can consider outpatient evaluation for atrial fibrillation burden and possible need for anticoagulation.Per tele patient has remains in sinus rhythm throughout admission.  -Elevated troponins in setting of hypertensive encephalopathy most consistent with demand/supply mismatch and not ACS.   Ok for discharge today from a cardiac perspective. Will arrange for follow up in clinic with Dr. Custovic in 1-2 weeks.   This patient's plan of care was discussed and created with Dr. Beau Bound and he is in agreement.  Signed: Creighton Doffing, PA-C  07/03/2023, 6:22 PM [facility]

## 2023-07-04 ENCOUNTER — Observation Stay (HOSPITAL_BASED_OUTPATIENT_CLINIC_OR_DEPARTMENT_OTHER)
Admission: EM | Admit: 2023-07-04 | Discharge: 2023-07-04 | Disposition: A | Discharge status: Home / Self Care | Source: Home / Self Care | Attending: Emergency Medicine | Admitting: Emergency Medicine

## 2023-07-04 ENCOUNTER — Other Ambulatory Visit: Payer: Self-pay

## 2023-07-04 ENCOUNTER — Emergency Department

## 2023-07-04 DIAGNOSIS — D649 Anemia, unspecified: Secondary | ICD-10-CM | POA: Diagnosis present

## 2023-07-04 DIAGNOSIS — I214 Non-ST elevation (NSTEMI) myocardial infarction: Secondary | ICD-10-CM | POA: Diagnosis not present

## 2023-07-04 DIAGNOSIS — R531 Weakness: Principal | ICD-10-CM

## 2023-07-04 DIAGNOSIS — R42 Dizziness and giddiness: Secondary | ICD-10-CM

## 2023-07-04 DIAGNOSIS — I480 Paroxysmal atrial fibrillation: Secondary | ICD-10-CM | POA: Diagnosis present

## 2023-07-04 DIAGNOSIS — I10 Essential (primary) hypertension: Secondary | ICD-10-CM | POA: Diagnosis not present

## 2023-07-04 DIAGNOSIS — E785 Hyperlipidemia, unspecified: Secondary | ICD-10-CM | POA: Diagnosis not present

## 2023-07-04 DIAGNOSIS — I674 Hypertensive encephalopathy: Secondary | ICD-10-CM | POA: Diagnosis not present

## 2023-07-04 DIAGNOSIS — N1831 Chronic kidney disease, stage 3a: Secondary | ICD-10-CM | POA: Diagnosis present

## 2023-07-04 DIAGNOSIS — I2489 Other forms of acute ischemic heart disease: Secondary | ICD-10-CM

## 2023-07-04 DIAGNOSIS — Y92009 Unspecified place in unspecified non-institutional (private) residence as the place of occurrence of the external cause: Secondary | ICD-10-CM

## 2023-07-04 DIAGNOSIS — R7989 Other specified abnormal findings of blood chemistry: Secondary | ICD-10-CM | POA: Insufficient documentation

## 2023-07-04 DIAGNOSIS — N183 Chronic kidney disease, stage 3 unspecified: Secondary | ICD-10-CM | POA: Diagnosis not present

## 2023-07-04 DIAGNOSIS — W19XXXA Unspecified fall, initial encounter: Secondary | ICD-10-CM

## 2023-07-04 LAB — URINALYSIS, ROUTINE W REFLEX MICROSCOPIC
Bacteria, UA: NONE SEEN
Bilirubin Urine: NEGATIVE
Glucose, UA: NEGATIVE mg/dL
Ketones, ur: NEGATIVE mg/dL
Leukocytes,Ua: NEGATIVE
Nitrite: NEGATIVE
Protein, ur: NEGATIVE mg/dL
Specific Gravity, Urine: 1.005 (ref 1.005–1.030)
pH: 5 (ref 5.0–8.0)

## 2023-07-04 LAB — CBC
HCT: 31.4 % — ABNORMAL LOW (ref 36.0–46.0)
Hemoglobin: 10.3 g/dL — ABNORMAL LOW (ref 12.0–15.0)
MCH: 29.2 pg (ref 26.0–34.0)
MCHC: 32.8 g/dL (ref 30.0–36.0)
MCV: 89 fL (ref 80.0–100.0)
Platelets: 202 10*3/uL (ref 150–400)
RBC: 3.53 MIL/uL — ABNORMAL LOW (ref 3.87–5.11)
RDW: 14.5 % (ref 11.5–15.5)
WBC: 6.8 10*3/uL (ref 4.0–10.5)
nRBC: 0 % (ref 0.0–0.2)

## 2023-07-04 LAB — HEPARIN LEVEL (UNFRACTIONATED): Heparin Unfractionated: 0.85 [IU]/mL — ABNORMAL HIGH (ref 0.30–0.70)

## 2023-07-04 LAB — TROPONIN I (HIGH SENSITIVITY)
Troponin I (High Sensitivity): 1007 ng/L (ref <18)
Troponin I (High Sensitivity): 676 ng/L (ref <18)

## 2023-07-04 MED ORDER — LACTATED RINGERS IV SOLN
INTRAVENOUS | Status: DC
Start: 2023-07-04 — End: 2023-07-04

## 2023-07-04 MED ORDER — ASPIRIN 81 MG PO TBEC
81.0000 mg | DELAYED_RELEASE_TABLET | Freq: Every day | ORAL | Status: DC
Start: 2023-07-05 — End: 2023-07-04

## 2023-07-04 MED ORDER — ATORVASTATIN CALCIUM 20 MG PO TABS
40.0000 mg | ORAL_TABLET | Freq: Every day | ORAL | Status: DC
  Administered 2023-07-04: 40 mg via ORAL
  Filled 2023-07-04: qty 2

## 2023-07-04 MED ORDER — FOLIC ACID 1 MG PO TABS
500.0000 ug | ORAL_TABLET | Freq: Every day | ORAL | Status: DC
  Administered 2023-07-04: 0.5 mg via ORAL
  Filled 2023-07-04: qty 1

## 2023-07-04 MED ORDER — NITROGLYCERIN 0.4 MG SL SUBL: 0.4000 mg | SUBLINGUAL_TABLET | SUBLINGUAL | Status: DC | PRN

## 2023-07-04 MED ORDER — MAGNESIUM OXIDE -MG SUPPLEMENT 400 (240 MG) MG PO TABS
200.0000 mg | ORAL_TABLET | Freq: Every day | ORAL | Status: DC
  Administered 2023-07-04: 200 mg via ORAL
  Filled 2023-07-04: qty 1

## 2023-07-04 MED ORDER — ONDANSETRON HCL 4 MG/2ML IJ SOLN: 4.0000 mg | Freq: Four times a day (QID) | INTRAMUSCULAR | Status: DC | PRN

## 2023-07-04 MED ORDER — LOSARTAN POTASSIUM 50 MG PO TABS
25.0000 mg | ORAL_TABLET | Freq: Every day | ORAL | Status: DC
  Administered 2023-07-04: 25 mg via ORAL
  Filled 2023-07-04: qty 1

## 2023-07-04 MED ORDER — NITROGLYCERIN 2 % TD OINT
1.0000 [in_us] | TOPICAL_OINTMENT | Freq: Once | TRANSDERMAL | Status: AC
  Administered 2023-07-04: 1 [in_us] via TOPICAL
  Filled 2023-07-04: qty 1

## 2023-07-04 MED ORDER — PANTOPRAZOLE SODIUM 40 MG PO TBEC
40.0000 mg | DELAYED_RELEASE_TABLET | Freq: Every day | ORAL | Status: DC
  Administered 2023-07-04: 40 mg via ORAL
  Filled 2023-07-04: qty 1

## 2023-07-04 MED ORDER — ASPIRIN 81 MG PO CHEW
324.0000 mg | CHEWABLE_TABLET | Freq: Once | ORAL | Status: AC
  Administered 2023-07-04: 324 mg via ORAL
  Filled 2023-07-04: qty 4

## 2023-07-04 MED ORDER — HEPARIN (PORCINE) 25000 UT/250ML-% IV SOLN
550.0000 [IU]/h | INTRAVENOUS | Status: DC
  Administered 2023-07-04: 650 [IU]/h via INTRAVENOUS
  Filled 2023-07-04: qty 250

## 2023-07-04 MED ORDER — METOPROLOL TARTRATE 25 MG PO TABS
12.5000 mg | ORAL_TABLET | Freq: Two times a day (BID) | ORAL | Status: DC
  Administered 2023-07-04 (×2): 12.5 mg via ORAL
  Filled 2023-07-04 (×2): qty 1

## 2023-07-04 MED ORDER — HEPARIN SODIUM (PORCINE) 5000 UNIT/ML IJ SOLN
60.0000 [IU]/kg | Freq: Once | INTRAMUSCULAR | Status: AC
  Administered 2023-07-04: 3350 [IU] via INTRAVENOUS
  Filled 2023-07-04: qty 1

## 2023-07-04 MED ORDER — HEPARIN (PORCINE) 25000 UT/250ML-% IV SOLN
14.0000 [IU]/kg/h | INTRAVENOUS | Status: DC
Start: 2023-07-04 — End: 2023-07-04

## 2023-07-04 MED ORDER — ACETAMINOPHEN 325 MG PO TABS: 650.0000 mg | ORAL_TABLET | ORAL | Status: DC | PRN

## 2023-07-04 MED ORDER — FERROUS SULFATE 325 (65 FE) MG PO TABS
325.0000 mg | ORAL_TABLET | Freq: Every day | ORAL | Status: DC
  Administered 2023-07-04: 325 mg via ORAL
  Filled 2023-07-04: qty 1

## 2023-07-04 NOTE — Progress Notes (Signed)
 [facility] CARDIOLOGY PROGRESS NOTE       Patient ID: Carrie Rodriguez MRN: [id number] DOB/AGE: 88-Oct-1935 88 y.o.  Admit date: 07/04/2023 Referring Physician Dr. Luna Salinas Primary Physician Sparks, Rosalynn Come, MD Primary Cardiologist [facility] Reason for Consultation Elevated troponins  HPI: Carrie Rodriguez is a 89 y.o. female  with a past medical history of remote history of atrial fibrillation, CKD 3A, HTN, pulmonary nodules who presented to the ED on 07/04/2023 for visual disturbances and elevated SBP > 200. Patient admitted with hypertensive encephalopathy. Troponins were found to be elevated. Cardiology was consulted for further evaluation.   Interval History: -Patient seen and examined this AM and laying comfortably in hospital bed. Patient states she is feeling well this AM and continues deny any chest pain, palpitations, SOB or other cardiac sxs.  -Patients BP and HR  stable. Overnight Tele showed no significant events.  -Patient remains on room air with stable SpO2.   Review of systems complete and found to be negative unless listed above    Past Medical History:  Diagnosis Date   AF (atrial fibrillation) (HCC)    Cancer (HCC)    Dysrhythmia    A FIB   GERD (gastroesophageal reflux disease)    HOH (hard of hearing)    PAF (paroxysmal atrial fibrillation) (HCC)    Palpitations    Pulmonary nodules     Past Surgical History:  Procedure Laterality Date   ABDOMINAL HYSTERECTOMY     CATARACT EXTRACTION W/PHACO Right 01/13/2018   Procedure: CATARACT EXTRACTION PHACO AND INTRAOCULAR LENS PLACEMENT (IOC);  Surgeon: Clair Crews, MD;  Location: [facility] ORS;  Service: Ophthalmology;  Laterality: Right;  US   01:01 CDE 8.84 Fluid pack lot # [id number]   CATARACT EXTRACTION W/PHACO Left 02/17/2018   Procedure: CATARACT EXTRACTION PHACO AND INTRAOCULAR LENS PLACEMENT (IOC) LEFT;  Surgeon: Clair Crews, MD;  Location: [facility] ORS;  Service: Ophthalmology;  Laterality: Left;   US  00:36.5 CDE 5.01 Fluid pack lot # [id number]   COLONOSCOPY WITH PROPOFOL  N/A 08/30/2019   Procedure: COLONOSCOPY WITH PROPOFOL ;  Surgeon: Selena Daily, MD;  Location: [facility] ENDOSCOPY;  Service: Gastroenterology;  Laterality: N/A;   ESOPHAGOGASTRODUODENOSCOPY (EGD) WITH PROPOFOL  N/A 07/11/2017   Procedure: ESOPHAGOGASTRODUODENOSCOPY (EGD) WITH PROPOFOL ;  Surgeon: Deveron Fly, MD;  Location: [facility] ENDOSCOPY;  Service: Endoscopy;  Laterality: N/A;   ESOPHAGOGASTRODUODENOSCOPY (EGD) WITH PROPOFOL  N/A 08/30/2019   Procedure: ESOPHAGOGASTRODUODENOSCOPY (EGD) WITH PROPOFOL ;  Surgeon: Selena Daily, MD;  Location: [facility] ENDOSCOPY;  Service: Gastroenterology;  Laterality: N/A;    (Not in a hospital admission)  Social History   Socioeconomic History   Marital status: Widowed    Spouse name: Not on file   Number of children: Not on file   Years of education: Not on file   Highest education level: Not on file  Occupational History   Not on file  Tobacco Use   Smoking status: Never   Smokeless tobacco: Never  Vaping Use   Vaping status: Never Used  Substance and Sexual Activity   Alcohol use: Not Currently    Comment: 1 glass of wine a year    Drug use: Not Currently   Sexual activity: Yes    Birth control/protection: Surgical  Other Topics Concern   Not on file  Social History Narrative   Not on file   Social Drivers of Health   Financial Resource Strain: Low Risk  (11/26/2022)   Received from [facility]   Overall Financial Resource Strain (  CARDIA)    Difficulty of Paying Living Expenses: Not hard at all  Food Insecurity: No Food Insecurity (07/04/2023)   Hunger Vital Sign    Worried About Running Out of Food in the Last Year: Never true    Ran Out of Food in the Last Year: Never true  Transportation Needs: No Transportation Needs (07/04/2023)   PRAPARE - Administrator, Civil Service (Medical): No    Lack of Transportation  (Non-Medical): No  Physical Activity: Not on file  Stress: Not on file  Social Connections: Moderately Integrated (07/04/2023)   Social Connection and Isolation Panel [NHANES]    Frequency of Communication with Friends and Family: More than three times a week    Frequency of Social Gatherings with Friends and Family: Twice a week    Attends Religious Services: 1 to 4 times per year    Active Member of [employer] or Organizations: Yes    Attends Banker Meetings: 1 to 4 times per year    Marital Status: Widowed  Intimate Partner Violence: Not At Risk (07/04/2023)   Humiliation, Afraid, Rape, and Kick questionnaire    Fear of Current or Ex-Partner: No    Emotionally Abused: No    Physically Abused: No    Sexually Abused: No    Family History  Problem Relation Age of Onset   Breast cancer Neg Hx      Vitals:   07/04/23 0700 07/04/23 0800 07/04/23 0830 07/04/23 0927  BP: 118/61 (!) 147/68 (!) 168/81   Pulse: (!) 59 77 73   Resp: 13 16 17    Temp:    98.7 F (37.1 C)  TempSrc:    Oral  SpO2: 97% 100% 100%     PHYSICAL EXAM General: well appearing elderly female, well nourished, in no acute distress. HEENT: Normocephalic and atraumatic. Neck: No JVD.   Lungs: Normal respiratory effort on room air. Clear bilaterally to auscultation. No wheezes, crackles, rhonchi.  Heart: HRRR. Normal S1 and S2 without gallops or murmurs.  Abdomen: Non-distended appearing.  Msk: Normal strength and tone for age. Extremities: Warm and well perfused. No clubbing, cyanosis. No edema.  Neuro: Alert and oriented X 3. Psych: Answers questions appropriately.   Labs: Basic Metabolic Panel: Recent Labs    07/02/23 2225 07/03/23 2157  NA 137 133*  K 4.2 3.6  CL 103 104  CO2 24 22  GLUCOSE 93 104*  BUN 25* 22  CREATININE 1.29* 1.16*  CALCIUM  9.5 8.6*   Liver Function Tests: Recent Labs    07/02/23 2225 07/03/23 2157  AST 19 21  ALT 11 11  ALKPHOS 64 57  BILITOT 0.5 0.8  PROT  6.8 6.8  ALBUMIN 4.1 4.0   No results for input(s): "LIPASE", "AMYLASE" in the last 72 hours. CBC: Recent Labs    07/02/23 2225 07/03/23 2157 07/04/23 0910  WBC 7.1 7.6 6.8  NEUTROABS 2.6  --   --   HGB 10.4* 10.4* 10.3*  HCT 32.2* 32.0* 31.4*  MCV 90.7 89.4 89.0  PLT 206 196 202   Cardiac Enzymes: Recent Labs    07/03/23 1022 07/03/23 2157 07/04/23 0910  TROPONINIHS 338* 1,007* 676*   BNP: No results for input(s): "BNP" in the last 72 hours. D-Dimer: No results for input(s): "DDIMER" in the last 72 hours. Hemoglobin A1C: Recent Labs    07/03/23 0037  HGBA1C 5.2   Fasting Lipid Panel: Recent Labs    07/03/23 0506  CHOL 228*  HDL 60  LDLCALC 156*  TRIG 59  CHOLHDL 3.8   Thyroid  Function Tests: No results for input(s): "TSH", "T4TOTAL", "T3FREE", "THYROIDAB" in the last 72 hours.  Invalid input(s): "FREET3" Anemia Panel: Recent Labs    07/03/23 0037  VITAMINB12 238  FOLATE 38.0  FERRITIN 91  TIBC 354  IRON 36  RETICCTPCT 1.2     Radiology: [facility] Chest Port 1 View Result Date: 07/04/2023 CLINICAL DATA:  Weakness and subsequent fall. EXAM: PORTABLE CHEST 1 VIEW COMPARISON:  October 04, 2014 FINDINGS: The heart size and mediastinal contours are within normal limits. Moderate severity calcification of the aortic arch is noted. The lungs are mildly hyperinflated. Mild areas of scarring and/or atelectasis are seen within the bilateral apices. No acute infiltrate, pleural effusion or pneumothorax is identified. The visualized skeletal structures are unremarkable. IMPRESSION: No active cardiopulmonary disease. Electronically Signed   By: Virgle Grime M.D.   On: 07/04/2023 01:36   ECHOCARDIOGRAM COMPLETE Result Date: 07/03/2023    ECHOCARDIOGRAM REPORT   Patient Name:   Carrie Rodriguez [facility] Date of Exam: 07/03/2023 Medical Rec #:  [id number]       Height:       64.0 in Accession #:    [id number]      Weight:       123.0 lb Date of Birth:  1933/11/07       BSA:           1.591 m Patient Age:    89 years        BP:           150/60 mmHg Patient Gender: F               HR:           73 bpm. Exam Location:  [facility] Procedure: 2D Echo (Both Spectral and Color Flow Doppler were utilized during            procedure). Indications:     stroke  History:         Patient has prior history of Echocardiogram examinations, most                  recent 11/21/2017. Chronic kidney disease,                  Arrythmias:paroxysmal a-fib; Risk Factors:Hypertension and                  Dyslipidemia.  Sonographer:     Dione Franks RDCS Referring Phys:  [id number] Lanetta Pion Diagnosing Phys: Constancia Delton MD IMPRESSIONS  1. Left ventricular ejection fraction, by estimation, is 60 to 65%. The left ventricle has normal function. The left ventricle has no regional wall motion abnormalities. Left ventricular diastolic parameters were normal.  2. Right ventricular systolic function is normal. The right ventricular size is normal. There is normal pulmonary artery systolic pressure.  3. The mitral valve is normal in structure. Mild mitral valve regurgitation.  4. The aortic valve is tricuspid. Aortic valve regurgitation is not visualized.  5. The inferior vena cava is normal in size with greater than 50% respiratory variability, suggesting right atrial pressure of 3 mmHg. FINDINGS  Left Ventricle: Left ventricular ejection fraction, by estimation, is 60 to 65%. The left ventricle has normal function. The left ventricle has no regional wall motion abnormalities. The left ventricular internal cavity size was normal in size. There is  no left ventricular hypertrophy. Left ventricular diastolic parameters were normal. Right Ventricle: The right ventricular  size is normal. No increase in right ventricular wall thickness. Right ventricular systolic function is normal. There is normal pulmonary artery systolic pressure. The tricuspid regurgitant velocity is 2.25 m/s, and  with an assumed right atrial pressure of  3 mmHg, the estimated right ventricular systolic pressure is 23.2 mmHg. Left Atrium: Left atrial size was normal in size. Right Atrium: Right atrial size was normal in size. Pericardium: There is no evidence of pericardial effusion. Mitral Valve: The mitral valve is normal in structure. Mild mitral valve regurgitation. Tricuspid Valve: The tricuspid valve is normal in structure. Tricuspid valve regurgitation is mild. Aortic Valve: The aortic valve is tricuspid. Aortic valve regurgitation is not visualized. Pulmonic Valve: The pulmonic valve was normal in structure. Pulmonic valve regurgitation is mild. Aorta: The aortic root is normal in size and structure. Venous: The inferior vena cava is normal in size with greater than 50% respiratory variability, suggesting right atrial pressure of 3 mmHg. IAS/Shunts: No atrial level shunt detected by color flow Doppler.  LEFT VENTRICLE PLAX 2D LVIDd:         4.10 cm   Diastology LVIDs:         2.60 cm   LV e' medial:    7.40 cm/s LV PW:         0.80 cm   LV E/e' medial:  14.1 LV IVS:        0.80 cm   LV e' lateral:   10.30 cm/s LVOT diam:     1.70 cm   LV E/e' lateral: 10.1 LV SV:         52 LV SV Index:   33 LVOT Area:     2.27 cm  RIGHT VENTRICLE             IVC RV Basal diam:  2.30 cm     IVC diam: 1.00 cm RV S prime:     15.00 cm/s TAPSE (M-mode): 2.1 cm LEFT ATRIUM             Index        RIGHT ATRIUM          Index LA diam:        3.00 cm 1.89 cm/m   RA Area:     9.78 cm LA Vol (A2C):   29.1 ml 18.29 ml/m  RA Volume:   20.70 ml 13.01 ml/m LA Vol (A4C):   34.8 ml 21.87 ml/m LA Biplane Vol: 33.3 ml 20.93 ml/m  AORTIC VALVE LVOT Vmax:   95.30 cm/s LVOT Vmean:  64.400 cm/s LVOT VTI:    0.228 m  AORTA Ao Root diam: 2.90 cm Ao Asc diam:  3.20 cm MITRAL VALVE                TRICUSPID VALVE MV Area (PHT): 3.85 cm     TR Peak grad:   20.2 mmHg MV Decel Time: 197 msec     TR Vmax:        225.00 cm/s MV E velocity: 104.00 cm/s MV A velocity: 108.00 cm/s  SHUNTS MV E/A  ratio:  0.96         Systemic VTI:  0.23 m                             Systemic Diam: 1.70 cm Constancia Delton MD Electronically signed by Constancia Delton MD Signature Date/Time: 07/03/2023/4:46:01 PM    Final    MR BRAIN WO  CONTRAST Result Date: 07/03/2023 CLINICAL DATA:  Initial evaluation for acute headache, dizziness. EXAM: MRI HEAD WITHOUT CONTRAST TECHNIQUE: Multiplanar, multiecho pulse sequences of the brain and surrounding structures were obtained without intravenous contrast. COMPARISON:  CT from 07/02/2023 FINDINGS: Brain: Cerebral volume within normal limits. Patchy T2/FLAIR hyperintensity involving the supratentorial cerebral white matter, most characteristic of chronic microvascular ischemic disease, minor for age. No evidence for acute or subacute infarct. No areas of chronic cortical infarction. No acute or chronic intracranial blood products. No mass lesion, midline shift or mass effect no hydrocephalus or extra-axial fluid collection. Pituitary gland within normal limits. Vascular: Major intracranial vascular flow voids are maintained. Skull and upper cervical spine: Cranial junction within normal limits. Bone marrow signal intensity within normal limits. No scalp soft tissue abnormality. Apparent focus of FLAIR signal intensity along the midline at the high anterior scalp noted, felt to be consistent with artifact. Sinuses/Orbits: Prior bilateral ocular lens replacement. Paranasal sinuses are largely clear. No significant mastoid effusion. Other: None. IMPRESSION: 1. No acute intracranial abnormality. 2. Mild chronic microvascular ischemic disease for age. Electronically Signed   By: Virgia Griffins M.D.   On: 07/03/2023 01:37   CT HEAD CODE STROKE WO CONTRAST Result Date: 07/02/2023 CLINICAL DATA:  Code stroke. Initial evaluation for neuro deficit, stroke suspected. EXAM: CT HEAD WITHOUT CONTRAST TECHNIQUE: Contiguous axial images were obtained from the base of the skull through the  vertex without intravenous contrast. RADIATION DOSE REDUCTION: This exam was performed according to the departmental dose-optimization program which includes automated exposure control, adjustment of the mA and/or kV according to patient size and/or use of iterative reconstruction technique. COMPARISON:  Prior study from 11/21/2017 FINDINGS: Brain: Cerebral volume within normal limits. No acute intracranial hemorrhage. No acute large vessel territory infarct. No mass lesion or midline shift. No hydrocephalus or extra-axial fluid collection. Vascular: No abnormal hyperdense vessel. Skull: Scalp soft tissues within normal limits for age calvarium intact. Sinuses/Orbits: Globes orbital soft tissues demonstrate no acute finding. Paranasal sinuses and mastoid air cells are largely clear. Other: None. ASPECTS [facility] Stroke Program Early CT Score) - Ganglionic level infarction (caudate, lentiform nuclei, internal capsule, insula, M1-M3 cortex): 7 - Supraganglionic infarction (M4-M6 cortex): 3 Total score (0-10 with 10 being normal): 10 IMPRESSION: 1. Negative head CT.  No acute intracranial abnormality 2. ASPECTS is 10. Results were called by telephone at the time of interpretation on 07/02/2023 at 10:28 pm to provider Dr. Hendrick Locke, who verbally acknowledged these results. Electronically Signed   By: Virgia Griffins M.D.   On: 07/02/2023 22:29    ECHO as above  TELEMETRY reviewed by me 07/04/2023: sinus rhythm, rate 70s  EKG reviewed by me: sinus rhythm, rate 75 bpm  Data reviewed by me 07/04/2023: last 24h vitals tele labs imaging I/O ED provider note, admission H&P.  Principal Problem:   NSTEMI (non-ST elevated myocardial infarction) (HCC) Active Problems:   PAF (paroxysmal atrial fibrillation) (HCC)   Anemia   Stage 3a chronic kidney disease (HCC)   Fall at home, initial encounter   Essential hypertension    ASSESSMENT AND PLAN:  Carrie Rodriguez is a 89 y.o. female  with a past medical history  of remote history of atrial fibrillation, CKD 3A, HTN, pulmonary nodules who presented to the ED on 07/02/2023 for visual disturbances and elevated SBP > 200. Patient admitted with hypertensive encephalopathy. Troponins were found to be elevated. Cardiology was consulted for further evaluation.   # Hypertensive Encephalopathy  # Deman Ischemia  #  Hypertension # Hyperlipidemia # CKD stage 3a Patient with no known history of CAD or heart failure presents to ED due to visual disturbances and SBP > 200.  Trops elevated and trending 33 > 95 > 338 > 710 > 1100. EKG in ED with sinus rhythm, rate 75 bpm with no acute ischemic changes. Lipid panel with cholesterol 228, LDL 156. S/p 1 dose of IV 5 mg labetalol SBP improved to 160s. Echo this admission with pEF, no RWMA. -Continue amlodipine 5 mg daily -Continue losartan 25 mg daily -Continue atorvastatin  40 mg daily due to elevated LDL.   -Patient has remote history of atrial fibrillation and never placed on anticoagulation. Per chart review there is no sign of atrial fibrillation on EKGs. Can consider outpatient evaluation for atrial fibrillation burden and possible need for anticoagulation.Per tele patient has remains in sinus rhythm throughout admission.  -Elevated troponins in setting of hypertensive encephalopathy most consistent with demand/supply mismatch and not ACS. Patient has no known history of CAD, very active, walks 3 miles a day 5x a week with no cardiac sxs.   Ok for discharge today from a cardiac perspective. Will arrange for follow up in clinic with Dr. Custovic in 1-2 weeks.   This patient's plan of care was discussed and created with Dr. Beau Bound and he is in agreement.  Signed: Creighton Doffing, PA-C  07/04/2023, 11:37 AM [facility]

## 2023-07-04 NOTE — Assessment & Plan Note (Addendum)
 Myocardial injury, possibly related to ACS versus demand ischemia from recent hypertensive urgency Patient presents with weakness and a fall, troponin bump 338--1007, lateral ST depressions Patient denies chest pain Received chewable aspirin  in the ED Continue heparin infusion Continue atorvastatin .  Will add aspirin .  Will replace home propranolol with metoprolol Will keep n.p.o. in case of procedure Cardiology consulted  Discussed plan with daughter at bedside

## 2023-07-04 NOTE — ED Notes (Signed)
 Report given to taylor RN

## 2023-07-04 NOTE — Assessment & Plan Note (Signed)
 Stable

## 2023-07-04 NOTE — Discharge Summary (Addendum)
 Physician Discharge Summary   Patient: Carrie Rodriguez MRN: [id number] DOB: October 02, 1933  Admit date:     07/04/2023  Discharge date: 07/04/2023  Discharge Physician: Sheril Dines   PCP: Yehuda Helms, MD   Recommendations at discharge:   Follow-up with PCP in 1 week  Discharge Diagnoses: Principal Problem:   Fall at home, initial encounter Active Problems:   Demand ischemia (HCC)   PAF (paroxysmal atrial fibrillation) (HCC)   Anemia   Stage 3a chronic kidney disease (HCC)   Essential hypertension   Elevated troponin  Resolved Problems:   * No resolved hospital problems. [facility] Course:  Carrie Rodriguez is a 89 y.o. female with medical history significant for Paroxysmal A-fib, CKD 3A, GERD, pulmonary nodules, HTN, admitted overnight from 5/21 to 5/22 for hypertensive encephalopathy initially presenting as code stroke, which was ruled out.  She returned to the emergency room on 07/03/2023 (less than  24 hours later) with general weakness and fall.  She says she was going to take a shower and she felt weak and fell.  She lives alone so she had to crawl to the living room to call 911.  She did not have any injuries.  Weakness has been going on for several weeks.  In the ED, BP was 152/74 and other vital signs were normal.  Her troponin was elevated at 338 which went up to 1,007.  She was admitted to the hospital for suspected NSTEMI.  She was started on IV heparin drip.  She was evaluated by the cardiologist who felt that clinical presentation was not consistent with acute NSTEMI or ACS.  Elevated troponins were attributed to demand ischemia.  Repeat troponin came down from 1,007 to 676.  She feels better and patient was cleared for discharge by cardiologist.  She is deemed stable for discharge.         Consultants: Cardiologist Procedures performed: None Disposition: Home Diet recommendation:  Discharge Diet Orders (From admission, onward)     Start     Ordered    07/04/23 0000  Diet - low sodium heart healthy        07/04/23 1148           Cardiac diet DISCHARGE MEDICATION: Allergies as of 07/04/2023   No Known Allergies      Medication List     TAKE these medications    acetaminophen  500 MG tablet Commonly known as: TYLENOL  Take 1,000 mg by mouth daily as needed (arthritis pain).   atorvastatin  40 MG tablet Commonly known as: LIPITOR Take 1 tablet (40 mg total) by mouth daily.   cyanocobalamin 1000 MCG tablet Commonly known as: VITAMIN B12 Take 1 tablet (1,000 mcg total) by mouth daily.   ferrous sulfate 325 (65 FE) MG EC tablet Take 325 mg by mouth as directed. Take 1 tablet 3 days a week.   folic acid  400 MCG tablet Commonly known as: FOLVITE  Take 400 mcg by mouth daily.   losartan 25 MG tablet Commonly known as: COZAAR Take 1 tablet (25 mg total) by mouth daily.   Magnesium 250 MG Tabs Take 250 mg by mouth daily.   propranolol ER 60 MG 24 hr capsule Commonly known as: INDERAL LA Take 60 mg by mouth daily.   RABEprazole 20 MG tablet Commonly known as: ACIPHEX Take 20 mg by mouth 2 (two) times daily.   Vitamin D2 10 MCG (400 UNIT) Tabs Take 400 Units by mouth daily.  Follow-up Information     Custovic, Lanell Pinta, DO. Go in 1 week(s).   Specialty: Cardiology Contact information: [address] [place] [address] [phone]                Discharge Exam:  GEN: NAD SKIN: Warm and dry EYES: No pallor or icterus ENT: MMM CV: RRR PULM: CTA B ABD: soft, ND, NT, +BS CNS: AAO x 3, non focal EXT: No edema or tenderness   Condition at discharge: good  The results of significant diagnostics from this hospitalization (including imaging, microbiology, ancillary and laboratory) are listed below for reference.   Imaging Studies: DG Chest Port 1 View Result Date: 07/04/2023 CLINICAL DATA:  Weakness and subsequent fall. EXAM: PORTABLE CHEST 1 VIEW COMPARISON:  October 04, 2014  FINDINGS: The heart size and mediastinal contours are within normal limits. Moderate severity calcification of the aortic arch is noted. The lungs are mildly hyperinflated. Mild areas of scarring and/or atelectasis are seen within the bilateral apices. No acute infiltrate, pleural effusion or pneumothorax is identified. The visualized skeletal structures are unremarkable. IMPRESSION: No active cardiopulmonary disease. Electronically Signed   By: Virgle Grime M.D.   On: 07/04/2023 01:36   ECHOCARDIOGRAM COMPLETE Result Date: 07/03/2023    ECHOCARDIOGRAM REPORT   Patient Name:   Carrie Rodriguez Date of Exam: 07/03/2023 Medical Rec #:  [id number]       Height:       64.0 in Accession #:    [id number]      Weight:       123.0 lb Date of Birth:  1933-07-05       BSA:          1.591 m Patient Age:    89 years        BP:           150/60 mmHg Patient Gender: F               HR:           73 bpm. Exam Location:  [facility] Procedure: 2D Echo (Both Spectral and Color Flow Doppler were utilized during            procedure). Indications:     stroke  History:         Patient has prior history of Echocardiogram examinations, most                  recent 11/21/2017. Chronic kidney disease,                  Arrythmias:paroxysmal a-fib; Risk Factors:Hypertension and                  Dyslipidemia.  Sonographer:     Dione Franks RDCS Referring Phys:  [id number] Lanetta Pion Diagnosing Phys: Constancia Delton MD IMPRESSIONS  1. Left ventricular ejection fraction, by estimation, is 60 to 65%. The left ventricle has normal function. The left ventricle has no regional wall motion abnormalities. Left ventricular diastolic parameters were normal.  2. Right ventricular systolic function is normal. The right ventricular size is normal. There is normal pulmonary artery systolic pressure.  3. The mitral valve is normal in structure. Mild mitral valve regurgitation.  4. The aortic valve is tricuspid. Aortic valve regurgitation is not  visualized.  5. The inferior vena cava is normal in size with greater than 50% respiratory variability, suggesting right atrial pressure of 3 mmHg. FINDINGS  Left Ventricle: Left ventricular ejection fraction, by  estimation, is 60 to 65%. The left ventricle has normal function. The left ventricle has no regional wall motion abnormalities. The left ventricular internal cavity size was normal in size. There is  no left ventricular hypertrophy. Left ventricular diastolic parameters were normal. Right Ventricle: The right ventricular size is normal. No increase in right ventricular wall thickness. Right ventricular systolic function is normal. There is normal pulmonary artery systolic pressure. The tricuspid regurgitant velocity is 2.25 m/s, and  with an assumed right atrial pressure of 3 mmHg, the estimated right ventricular systolic pressure is 23.2 mmHg. Left Atrium: Left atrial size was normal in size. Right Atrium: Right atrial size was normal in size. Pericardium: There is no evidence of pericardial effusion. Mitral Valve: The mitral valve is normal in structure. Mild mitral valve regurgitation. Tricuspid Valve: The tricuspid valve is normal in structure. Tricuspid valve regurgitation is mild. Aortic Valve: The aortic valve is tricuspid. Aortic valve regurgitation is not visualized. Pulmonic Valve: The pulmonic valve was normal in structure. Pulmonic valve regurgitation is mild. Aorta: The aortic root is normal in size and structure. Venous: The inferior vena cava is normal in size with greater than 50% respiratory variability, suggesting right atrial pressure of 3 mmHg. IAS/Shunts: No atrial level shunt detected by color flow Doppler.  LEFT VENTRICLE PLAX 2D LVIDd:         4.10 cm   Diastology LVIDs:         2.60 cm   LV e' medial:    7.40 cm/s LV PW:         0.80 cm   LV E/e' medial:  14.1 LV IVS:        0.80 cm   LV e' lateral:   10.30 cm/s LVOT diam:     1.70 cm   LV E/e' lateral: 10.1 LV SV:         52 LV SV  Index:   33 LVOT Area:     2.27 cm  RIGHT VENTRICLE             IVC RV Basal diam:  2.30 cm     IVC diam: 1.00 cm RV S prime:     15.00 cm/s TAPSE (M-mode): 2.1 cm LEFT ATRIUM             Index        RIGHT ATRIUM          Index LA diam:        3.00 cm 1.89 cm/m   RA Area:     9.78 cm LA Vol (A2C):   29.1 ml 18.29 ml/m  RA Volume:   20.70 ml 13.01 ml/m LA Vol (A4C):   34.8 ml 21.87 ml/m LA Biplane Vol: 33.3 ml 20.93 ml/m  AORTIC VALVE LVOT Vmax:   95.30 cm/s LVOT Vmean:  64.400 cm/s LVOT VTI:    0.228 m  AORTA Ao Root diam: 2.90 cm Ao Asc diam:  3.20 cm MITRAL VALVE                TRICUSPID VALVE MV Area (PHT): 3.85 cm     TR Peak grad:   20.2 mmHg MV Decel Time: 197 msec     TR Vmax:        225.00 cm/s MV E velocity: 104.00 cm/s MV A velocity: 108.00 cm/s  SHUNTS MV E/A ratio:  0.96         Systemic VTI:  0.23 m  Systemic Diam: 1.70 cm Constancia Delton MD Electronically signed by Constancia Delton MD Signature Date/Time: 07/03/2023/4:46:01 PM    Final    MR BRAIN WO CONTRAST Result Date: 07/03/2023 CLINICAL DATA:  Initial evaluation for acute headache, dizziness. EXAM: MRI HEAD WITHOUT CONTRAST TECHNIQUE: Multiplanar, multiecho pulse sequences of the brain and surrounding structures were obtained without intravenous contrast. COMPARISON:  CT from 07/02/2023 FINDINGS: Brain: Cerebral volume within normal limits. Patchy T2/FLAIR hyperintensity involving the supratentorial cerebral white matter, most characteristic of chronic microvascular ischemic disease, minor for age. No evidence for acute or subacute infarct. No areas of chronic cortical infarction. No acute or chronic intracranial blood products. No mass lesion, midline shift or mass effect no hydrocephalus or extra-axial fluid collection. Pituitary gland within normal limits. Vascular: Major intracranial vascular flow voids are maintained. Skull and upper cervical spine: Cranial junction within normal limits. Bone marrow  signal intensity within normal limits. No scalp soft tissue abnormality. Apparent focus of FLAIR signal intensity along the midline at the high anterior scalp noted, felt to be consistent with artifact. Sinuses/Orbits: Prior bilateral ocular lens replacement. Paranasal sinuses are largely clear. No significant mastoid effusion. Other: None. IMPRESSION: 1. No acute intracranial abnormality. 2. Mild chronic microvascular ischemic disease for age. Electronically Signed   By: Virgia Griffins M.D.   On: 07/03/2023 01:37   CT HEAD CODE STROKE WO CONTRAST Result Date: 07/02/2023 CLINICAL DATA:  Code stroke. Initial evaluation for neuro deficit, stroke suspected. EXAM: CT HEAD WITHOUT CONTRAST TECHNIQUE: Contiguous axial images were obtained from the base of the skull through the vertex without intravenous contrast. RADIATION DOSE REDUCTION: This exam was performed according to the departmental dose-optimization program which includes automated exposure control, adjustment of the mA and/or kV according to patient size and/or use of iterative reconstruction technique. COMPARISON:  Prior study from 11/21/2017 FINDINGS: Brain: Cerebral volume within normal limits. No acute intracranial hemorrhage. No acute large vessel territory infarct. No mass lesion or midline shift. No hydrocephalus or extra-axial fluid collection. Vascular: No abnormal hyperdense vessel. Skull: Scalp soft tissues within normal limits for age calvarium intact. Sinuses/Orbits: Globes orbital soft tissues demonstrate no acute finding. Paranasal sinuses and mastoid air cells are largely clear. Other: None. ASPECTS [facility] Stroke Program Early CT Score) - Ganglionic level infarction (caudate, lentiform nuclei, internal capsule, insula, M1-M3 cortex): 7 - Supraganglionic infarction (M4-M6 cortex): 3 Total score (0-10 with 10 being normal): 10 IMPRESSION: 1. Negative head CT.  No acute intracranial abnormality 2. ASPECTS is 10. Results were called by  telephone at the time of interpretation on 07/02/2023 at 10:28 pm to provider Dr. Hendrick Locke, who verbally acknowledged these results. Electronically Signed   By: Virgia Griffins M.D.   On: 07/02/2023 22:29    Microbiology: Results for orders placed or performed in visit on 10/12/19  Microscopic Examination     Status: Abnormal   Collection Time: 10/12/19  8:44 AM   Urine  Result Value Ref Range Status   WBC, UA 0-5 0 - 5 /hpf Final   RBC, Urine 3-10 (A) 0 - 2 /hpf Final   Epithelial Cells (non renal) None seen 0 - 10 /hpf Final   Bacteria, UA None seen None seen/Few Final    Labs: CBC: Recent Labs  Lab 07/02/23 2225 07/03/23 2157 07/04/23 0910  WBC 7.1 7.6 6.8  NEUTROABS 2.6  --   --   HGB 10.4* 10.4* 10.3*  HCT 32.2* 32.0* 31.4*  MCV 90.7 89.4 89.0  PLT 206 196 202  Basic Metabolic Panel: Recent Labs  Lab 07/02/23 2225 07/03/23 2157  NA 137 133*  K 4.2 3.6  CL 103 104  CO2 24 22  GLUCOSE 93 104*  BUN 25* 22  CREATININE 1.29* 1.16*  CALCIUM  9.5 8.6*   Liver Function Tests: Recent Labs  Lab 07/02/23 2225 07/03/23 2157  AST 19 21  ALT 11 11  ALKPHOS 64 57  BILITOT 0.5 0.8  PROT 6.8 6.8  ALBUMIN 4.1 4.0   CBG: Recent Labs  Lab 07/02/23 2211  GLUCAP 88    Discharge time spent: greater than 30 minutes.  Signed: Sheril Dines, MD [facility] 07/05/2023

## 2023-07-04 NOTE — ED Notes (Signed)
 Call to Dietary to expedite pt tray as pt is very hungry

## 2023-07-04 NOTE — ED Provider Notes (Signed)
 [facility] Provider Note    Event Date/Time   First MD Initiated Contact with Patient 07/04/23 [phone]     (approximate)   History   Weakness and Fall   HPI  Carrie Rodriguez is a 89 y.o. female brought to the ED via EMS from home with a chief complaint of generalized weakness, dizziness and fall.  Patient was seen in the ED yesterday and admitted for dizziness concerning for strokelike symptoms.  She was seen and discharged by the hospitalist team after negative MRI brain.  Tonight she was in the shower, felt dizzy and fell.  Denies syncope or LOC.  Had brief episode of chest pain.  Denies headache, vision changes, shortness of breath, diaphoresis, abdominal pain, nausea, vomiting, palpitations.  Concerned she is still having hallucinations and seeing things which was one of her chief complaints last night.     Past Medical History   Past Medical History:  Diagnosis Date   AF (atrial fibrillation) (HCC)    Cancer (HCC)    Dysrhythmia    A FIB   GERD (gastroesophageal reflux disease)    HOH (hard of hearing)    PAF (paroxysmal atrial fibrillation) (HCC)    Palpitations    Pulmonary nodules      Active Problem List   Patient Active Problem List   Diagnosis Date Noted   NSTEMI (non-ST elevated myocardial infarction) (HCC) 07/04/2023   Fall at home, initial encounter 07/04/2023   Essential hypertension 07/04/2023   Anemia 07/03/2023   Stage 3a chronic kidney disease (HCC) 07/03/2023   GERD (gastroesophageal reflux disease) 07/03/2023   Focal neurological symptom present 07/03/2023   Hypertensive encephalopathy 07/03/2023   Lumbar spondylosis 12/18/2018   Dizziness 11/20/2017   PAF (paroxysmal atrial fibrillation) (HCC) 08/29/2016   Pulmonary nodules 08/29/2016   Pure hypercholesterolemia 08/29/2016     Past Surgical History   Past Surgical History:  Procedure Laterality Date   ABDOMINAL HYSTERECTOMY     CATARACT EXTRACTION W/PHACO Right  01/13/2018   Procedure: CATARACT EXTRACTION PHACO AND INTRAOCULAR LENS PLACEMENT (IOC);  Surgeon: Clair Crews, MD;  Location: [facility] ORS;  Service: Ophthalmology;  Laterality: Right;  US   01:01 CDE 8.84 Fluid pack lot # [id number]   CATARACT EXTRACTION W/PHACO Left 02/17/2018   Procedure: CATARACT EXTRACTION PHACO AND INTRAOCULAR LENS PLACEMENT (IOC) LEFT;  Surgeon: Clair Crews, MD;  Location: [facility] ORS;  Service: Ophthalmology;  Laterality: Left;  US  00:36.5 CDE 5.01 Fluid pack lot # [id number]   COLONOSCOPY WITH PROPOFOL  N/A 08/30/2019   Procedure: COLONOSCOPY WITH PROPOFOL ;  Surgeon: Selena Daily, MD;  Location: [facility] ENDOSCOPY;  Service: Gastroenterology;  Laterality: N/A;   ESOPHAGOGASTRODUODENOSCOPY (EGD) WITH PROPOFOL  N/A 07/11/2017   Procedure: ESOPHAGOGASTRODUODENOSCOPY (EGD) WITH PROPOFOL ;  Surgeon: Deveron Fly, MD;  Location: [facility] ENDOSCOPY;  Service: Endoscopy;  Laterality: N/A;   ESOPHAGOGASTRODUODENOSCOPY (EGD) WITH PROPOFOL  N/A 08/30/2019   Procedure: ESOPHAGOGASTRODUODENOSCOPY (EGD) WITH PROPOFOL ;  Surgeon: Selena Daily, MD;  Location: [facility] ENDOSCOPY;  Service: Gastroenterology;  Laterality: N/A;     Home Medications   Prior to Admission medications   Medication Sig Start Date End Date Taking? Authorizing Provider  acetaminophen  (TYLENOL ) 500 MG tablet Take 1,000 mg by mouth daily as needed (arthritis pain).   Yes [provider]  atorvastatin  (LIPITOR) 40 MG tablet Take 1 tablet (40 mg total) by mouth daily. 07/04/23  Yes Amin, Sumayya, MD  cyanocobalamin (VITAMIN B12) 1000 MCG tablet Take 1 tablet (1,000 mcg total) by mouth daily.  07/03/23  Yes Amin, Sumayya, MD  Ergocalciferol (VITAMIN D2) 10 MCG (400 UNIT) TABS Take 400 Units by mouth daily.   Yes [provider]  ferrous sulfate 325 (65 FE) MG EC tablet Take 325 mg by mouth as directed. Take 1 tablet 3 days a week.   Yes [provider]  folic acid  (FOLVITE ) 400 MCG tablet  Take 400 mcg by mouth daily.   Yes [provider]  losartan (COZAAR) 25 MG tablet Take 1 tablet (25 mg total) by mouth daily. 07/03/23  Yes Amin, Sumayya, MD  Magnesium 250 MG TABS Take 250 mg by mouth daily.   Yes [provider]  propranolol ER (INDERAL LA) 60 MG 24 hr capsule Take 60 mg by mouth daily. 03/03/23  Yes [provider]  RABEprazole (ACIPHEX) 20 MG tablet Take 20 mg by mouth 2 (two) times daily. 08/21/19  Yes [provider]     Allergies  Patient has no known allergies.   Family History   Family History  Problem Relation Age of Onset   Breast cancer Neg Hx      Physical Exam  Triage Vital Signs: ED Triage Vitals  Encounter Vitals Group     BP 07/03/23 2151 (!) 152/74     Systolic BP Percentile --      Diastolic BP Percentile --      Pulse Rate 07/03/23 2151 81     Resp 07/03/23 2151 18     Temp 07/03/23 2151 97.7 F (36.5 C)     Temp Source 07/03/23 2151 Oral     SpO2 07/03/23 2151 95 %     Weight --      Height --      Head Circumference --      Peak Flow --      Pain Score 07/03/23 2149 0     Pain Loc --      Pain Education --      Exclude from Growth Chart --     Updated Vital Signs: BP 138/62   Pulse 70   Temp 97.8 F (36.6 C) (Oral)   Resp 15   SpO2 99%    General: Awake, mild distress.  CV:  RRR good peripheral perfusion.  Resp:  Normal effort.  CTAB. Abd:  Nontender.  No distention.  Other:  Head is atraumatic.  PERRL.  EOMI.  Nose is atraumatic.  No dental malocclusion.  No midline cervical spine tenderness to palpation, step-offs or deformities noted.  No carotid bruits.  Alert and oriented x 3.  CN II-XII grossly intact.  5/5 motor strength and sensation all extremities.  MAE x 4.   ED Results / Procedures / Treatments  Labs (all labs ordered are listed, but only abnormal results are displayed) Labs Reviewed  COMPREHENSIVE METABOLIC PANEL WITH GFR - Abnormal; Notable for the following  components:      Result Value   Sodium 133 (*)    Glucose, Bld 104 (*)    Creatinine, Ser 1.16 (*)    Calcium  8.6 (*)    GFR, Estimated 46 (*)    All other components within normal limits  CBC - Abnormal; Notable for the following components:   RBC 3.58 (*)    Hemoglobin 10.4 (*)    HCT 32.0 (*)    All other components within normal limits  URINALYSIS, ROUTINE W REFLEX MICROSCOPIC - Abnormal; Notable for the following components:   Color, Urine STRAW (*)    APPearance CLEAR (*)  Hgb urine dipstick MODERATE (*)    All other components within normal limits  TROPONIN I (HIGH SENSITIVITY) - Abnormal; Notable for the following components:   Troponin I (High Sensitivity) 1,007 (*)    All other components within normal limits  HEPARIN LEVEL (UNFRACTIONATED)  LIPOPROTEIN A (LPA)     EKG  ED ECG REPORT I, Kjuan Seipp J, the attending physician, personally viewed and interpreted this ECG.   Date: 07/04/2023  EKG Time: 2207  Rate: 79  Rhythm: normal sinus rhythm  Axis: Normal  Intervals:none  ST&T Change: Nonspecific    RADIOLOGY I have independently visualized and interpreted patient's imaging study as well as noted the radiology interpretation:  Chest x-ray: No acute cardiopulmonary process  Official radiology report(s): DG Chest Port 1 View Result Date: 07/04/2023 CLINICAL DATA:  Weakness and subsequent fall. EXAM: PORTABLE CHEST 1 VIEW COMPARISON:  October 04, 2014 FINDINGS: The heart size and mediastinal contours are within normal limits. Moderate severity calcification of the aortic arch is noted. The lungs are mildly hyperinflated. Mild areas of scarring and/or atelectasis are seen within the bilateral apices. No acute infiltrate, pleural effusion or pneumothorax is identified. The visualized skeletal structures are unremarkable. IMPRESSION: No active cardiopulmonary disease. Electronically Signed   By: Virgle Grime M.D.   On: 07/04/2023 01:36   ECHOCARDIOGRAM  COMPLETE Result Date: 07/03/2023    ECHOCARDIOGRAM REPORT   Patient Name:   Carrie [facility] Date of Exam: 07/03/2023 Medical Rec #:  [id number]       Height:       64.0 in Accession #:    [id number]      Weight:       123.0 lb Date of Birth:  [birth year]       BSA:          1.591 m Patient Age:    89 years        BP:           150/60 mmHg Patient Gender: F               HR:           73 bpm. Exam Location:  [facility] Procedure: 2D Echo (Both Spectral and Color Flow Doppler were utilized during            procedure). Indications:     stroke  History:         Patient has prior history of Echocardiogram examinations, most                  recent 11/21/2017. Chronic kidney disease,                  Arrythmias:paroxysmal a-fib; Risk Factors:Hypertension and                  Dyslipidemia.  Sonographer:     Dione Franks RDCS Referring Phys:  [id number] Lanetta Pion Diagnosing Phys: Constancia Delton MD IMPRESSIONS  1. Left ventricular ejection fraction, by estimation, is 60 to 65%. The left ventricle has normal function. The left ventricle has no regional wall motion abnormalities. Left ventricular diastolic parameters were normal.  2. Right ventricular systolic function is normal. The right ventricular size is normal. There is normal pulmonary artery systolic pressure.  3. The mitral valve is normal in structure. Mild mitral valve regurgitation.  4. The aortic valve is tricuspid. Aortic valve regurgitation is not visualized.  5. The inferior vena cava is normal in size with greater than 50%  respiratory variability, suggesting right atrial pressure of 3 mmHg. FINDINGS  Left Ventricle: Left ventricular ejection fraction, by estimation, is 60 to 65%. The left ventricle has normal function. The left ventricle has no regional wall motion abnormalities. The left ventricular internal cavity size was normal in size. There is  no left ventricular hypertrophy. Left ventricular diastolic parameters were normal. Right Ventricle: The  right ventricular size is normal. No increase in right ventricular wall thickness. Right ventricular systolic function is normal. There is normal pulmonary artery systolic pressure. The tricuspid regurgitant velocity is 2.25 m/s, and  with an assumed right atrial pressure of 3 mmHg, the estimated right ventricular systolic pressure is 23.2 mmHg. Left Atrium: Left atrial size was normal in size. Right Atrium: Right atrial size was normal in size. Pericardium: There is no evidence of pericardial effusion. Mitral Valve: The mitral valve is normal in structure. Mild mitral valve regurgitation. Tricuspid Valve: The tricuspid valve is normal in structure. Tricuspid valve regurgitation is mild. Aortic Valve: The aortic valve is tricuspid. Aortic valve regurgitation is not visualized. Pulmonic Valve: The pulmonic valve was normal in structure. Pulmonic valve regurgitation is mild. Aorta: The aortic root is normal in size and structure. Venous: The inferior vena cava is normal in size with greater than 50% respiratory variability, suggesting right atrial pressure of 3 mmHg. IAS/Shunts: No atrial level shunt detected by color flow Doppler.  LEFT VENTRICLE PLAX 2D LVIDd:         4.10 cm   Diastology LVIDs:         2.60 cm   LV e' medial:    7.40 cm/s LV PW:         0.80 cm   LV E/e' medial:  14.1 LV IVS:        0.80 cm   LV e' lateral:   10.30 cm/s LVOT diam:     1.70 cm   LV E/e' lateral: 10.1 LV SV:         52 LV SV Index:   33 LVOT Area:     2.27 cm  RIGHT VENTRICLE             IVC RV Basal diam:  2.30 cm     IVC diam: 1.00 cm RV S prime:     15.00 cm/s TAPSE (M-mode): 2.1 cm LEFT ATRIUM             Index        RIGHT ATRIUM          Index LA diam:        3.00 cm 1.89 cm/m   RA Area:     9.78 cm LA Vol (A2C):   29.1 ml 18.29 ml/m  RA Volume:   20.70 ml 13.01 ml/m LA Vol (A4C):   34.8 ml 21.87 ml/m LA Biplane Vol: 33.3 ml 20.93 ml/m  AORTIC VALVE LVOT Vmax:   95.30 cm/s LVOT Vmean:  64.400 cm/s LVOT VTI:    0.228 m   AORTA Ao Root diam: 2.90 cm Ao Asc diam:  3.20 cm MITRAL VALVE                TRICUSPID VALVE MV Area (PHT): 3.85 cm     TR Peak grad:   20.2 mmHg MV Decel Time: 197 msec     TR Vmax:        225.00 cm/s MV E velocity: 104.00 cm/s MV A velocity: 108.00 cm/s  SHUNTS MV E/A ratio:  0.96  Systemic VTI:  0.23 m                             Systemic Diam: 1.70 cm Constancia Delton MD Electronically signed by Constancia Delton MD Signature Date/Time: 07/03/2023/4:46:01 PM    Final      PROCEDURES:  Critical Care performed: Yes, see critical care procedure note(s)  CRITICAL CARE Performed by: Norlene Beavers   Total critical care time: 30 minutes  Critical care time was exclusive of separately billable procedures and treating other patients.  Critical care was necessary to treat or prevent imminent or life-threatening deterioration.  Critical care was time spent personally by me on the following activities: development of treatment plan with patient and/or surrogate as well as nursing, discussions with consultants, evaluation of patient's response to treatment, examination of patient, obtaining history from patient or surrogate, ordering and performing treatments and interventions, ordering and review of laboratory studies, ordering and review of radiographic studies, pulse oximetry and re-evaluation of patient's condition.   Aaron Aas1-3 Lead EKG Interpretation  Performed by: Norlene Beavers, MD Authorized by: Norlene Beavers, MD     Interpretation: normal     ECG rate:  79   ECG rate assessment: normal     Rhythm: sinus rhythm     Ectopy: none     Conduction: normal   Comments:     Patient placed on cardiac monitor to evaluate for arrhythmias    MEDICATIONS ORDERED IN ED: Medications  heparin ADULT infusion 100 units/mL (25000 units/250mL) (650 Units/hr Intravenous New Bag/Given 07/04/23 0127)  atorvastatin  (LIPITOR) tablet 40 mg (has no administration in time range)  losartan (COZAAR) tablet 25  mg (has no administration in time range)  pantoprazole  (PROTONIX ) EC tablet 40 mg (has no administration in time range)  magnesium oxide (MAG-OX) tablet 200 mg (has no administration in time range)  folic acid  (FOLVITE ) tablet 0.5 mg (has no administration in time range)  ferrous sulfate tablet 325 mg (has no administration in time range)  aspirin  EC tablet 81 mg (has no administration in time range)  nitroGLYCERIN (NITROSTAT) SL tablet 0.4 mg (has no administration in time range)  acetaminophen  (TYLENOL ) tablet 650 mg (has no administration in time range)  ondansetron  (ZOFRAN ) injection 4 mg (has no administration in time range)  metoprolol tartrate (LOPRESSOR) tablet 12.5 mg (12.5 mg Oral Given 07/04/23 0447)  heparin injection 3,350 Units (3,350 Units Intravenous Given 07/04/23 0127)  aspirin  chewable tablet 324 mg (324 mg Oral Given 07/04/23 0119)  nitroGLYCERIN (NITROGLYN) 2 % ointment 1 inch (1 inch Topical Given 07/04/23 0119)     IMPRESSION / MDM / ASSESSMENT AND PLAN / ED COURSE  I reviewed the triage vital signs and the nursing notes.                             89 year old female who returns to the ED for continued weakness, dizziness and brief episode of chest pain. Differential diagnosis includes, but is not limited to, ACS, aortic dissection, pulmonary embolism, cardiac tamponade, pneumothorax, pneumonia, pericarditis, myocarditis, GI-related causes including esophagitis/gastritis, and musculoskeletal chest wall pain.   I have personally reviewed patient's records and note her ED visit and hospital admission from yesterday.  Patient's presentation is most consistent with acute presentation with potential threat to life or bodily function.  The patient is on the cardiac monitor to evaluate for evidence of arrhythmia and/or  significant heart rate changes.  Laboratory results demonstrate improving creatinine from yesterday, elevated troponin more than tripled from peak yesterday.   Administer aspirin , nitroglycerin paste, start heparin bolus with infusion.  Will consult hospitalist services for evaluation and admission.    FINAL CLINICAL IMPRESSION(S) / ED DIAGNOSES   Final diagnoses:  Generalized weakness  NSTEMI (non-ST elevated myocardial infarction) (HCC)  Dizziness     Rx / DC Orders   ED Discharge Orders     None        Note:  This document was prepared using Dragon voice recognition software and may include unintentional dictation errors.   Sumer Moorehouse J, MD 07/04/23 [phone]

## 2023-07-04 NOTE — Assessment & Plan Note (Signed)
 -  Renal function  at baseline

## 2023-07-04 NOTE — Hospital Course (Signed)
 Carrie [facility]

## 2023-07-04 NOTE — Progress Notes (Signed)
 PHARMACY - ANTICOAGULATION CONSULT NOTE  Pharmacy Consult for Heparin  Indication: chest pain/ACS  No Known Allergies  Patient Measurements:    Vital Signs: Temp: 98.7 F (37.1 C) (05/23 0927) Temp Source: Oral (05/23 0927) BP: 168/81 (05/23 0830) Pulse Rate: 73 (05/23 0830)  Labs: Recent Labs    07/02/23 2225 07/03/23 0037 07/03/23 1022 07/03/23 2157 07/04/23 0910  HGB 10.4*  --   --  10.4* 10.3*  HCT 32.2*  --   --  32.0* 31.4*  PLT 206  --   --  196 202  APTT 37*  --   --   --   --   LABPROT 13.1  --   --   --   --   INR 1.0  --   --   --   --   HEPARINUNFRC  --   --   --   --  0.85*  CREATININE 1.29*  --   --  1.16*  --   TROPONINIHS 33* 95* 338* 1,007*  --     Estimated Creatinine Clearance: 29.5 mL/min (A) (by C-G formula based on SCr of 1.16 mg/dL (H)).   Medical History: Past Medical History:  Diagnosis Date   AF (atrial fibrillation) (HCC)    Cancer (HCC)    Dysrhythmia    A FIB   GERD (gastroesophageal reflux disease)    HOH (hard of hearing)    PAF (paroxysmal atrial fibrillation) (HCC)    Palpitations    Pulmonary nodules     Medications:  (Not in a hospital admission)   Assessment: Pharmacy consulted to dose heparin in this 89 year old female admitted with ACS/NSTEMI.  No prior anticoag noted. CrCl = 29.5 ml/min   Goal of Therapy:  Heparin level 0.3-0.7 units/ml Monitor platelets by anticoagulation protocol: Yes   5/23 0910 HL 0.85, supratherapeutic x 1 @ 650 u/hr  Plan:  HL is supratherapeutic Decrease heparin infusion to 550 units/hr Recheck HL in 8 hours after rate change CBC daily while on heparin   Ramonita Burow, PharmD 07/04/2023,9:50 AM

## 2023-07-04 NOTE — ED Notes (Signed)
 Pt helped to the bathroom to void, Admitting MD contacted regarding pt being NPO.

## 2023-07-04 NOTE — ED Notes (Signed)
Beather Arbour MD at bedside.

## 2023-07-04 NOTE — H&P (Signed)
 History and Physical    Patient: Carrie Rodriguez:[id number] DOB: April 25, 1933 DOA: 07/04/2023 DOS: the patient was seen and examined on 07/04/2023 PCP: Yehuda Helms, MD  Patient coming from: Home  Chief Complaint:  Chief Complaint  Patient presents with   Weakness   Fall    HPI: Carrie Rodriguez is a 89 y.o. female with medical history significant for Paroxysmal A-fib, CKD 3A, GERD, pulmonary nodules, HTN, admitted overnight from 5/21 to 5/22 for hypertensive encephalopathy initially presenting as code stroke, which was ruled out, who returns to the ED less than 24 hours later with weakness and a fall.  Patient, who lives alone, fell and had to crawl to the living room to call 911.  Daughter is currently at bedside.  She states her weakness has been ongoing for several weeks and seems to be getting progressively worse.  When she presented a couple days prior with code stroke her symptoms were disequilibrium and distortion of vision in her left eye with visual hallucinations, which she is still having. ED course and data review: BP 152/74 with otherwise normal vitals Labs notable for troponin 338-->1007 CMP and CBC otherwise stable from the day prior and notable for mild creatinine elevation of 1.16 and mild anemia of 10.4  EKG, personally viewed and interpreted showing NSR at 79 with mild ST depression lateral leads  Chest x-ray nonacute  Patient treated with aspirin  324, chewable and started on a heparin infusion.  Nitropaste placed as well  Hospitalist consulted for admission.     Review of Systems: As mentioned in the history of present illness. All other systems reviewed and are negative.  Past Medical History:  Diagnosis Date   AF (atrial fibrillation) (HCC)    Cancer (HCC)    Dysrhythmia    A FIB   GERD (gastroesophageal reflux disease)    HOH (hard of hearing)    PAF (paroxysmal atrial fibrillation) (HCC)    Palpitations    Pulmonary nodules    Past Surgical  History:  Procedure Laterality Date   ABDOMINAL HYSTERECTOMY     CATARACT EXTRACTION W/PHACO Right 01/13/2018   Procedure: CATARACT EXTRACTION PHACO AND INTRAOCULAR LENS PLACEMENT (IOC);  Surgeon: Clair Crews, MD;  Location: [facility] ORS;  Service: Ophthalmology;  Laterality: Right;  US   01:01 CDE 8.84 Fluid pack lot # [id number]   CATARACT EXTRACTION W/PHACO Left 02/17/2018   Procedure: CATARACT EXTRACTION PHACO AND INTRAOCULAR LENS PLACEMENT (IOC) LEFT;  Surgeon: Clair Crews, MD;  Location: [facility] ORS;  Service: Ophthalmology;  Laterality: Left;  US  00:36.5 CDE 5.01 Fluid pack lot # [id number]   COLONOSCOPY WITH PROPOFOL  N/A 08/30/2019   Procedure: COLONOSCOPY WITH PROPOFOL ;  Surgeon: Selena Daily, MD;  Location: [facility] ENDOSCOPY;  Service: Gastroenterology;  Laterality: N/A;   ESOPHAGOGASTRODUODENOSCOPY (EGD) WITH PROPOFOL  N/A 07/11/2017   Procedure: ESOPHAGOGASTRODUODENOSCOPY (EGD) WITH PROPOFOL ;  Surgeon: Deveron Fly, MD;  Location: [facility] ENDOSCOPY;  Service: Endoscopy;  Laterality: N/A;   ESOPHAGOGASTRODUODENOSCOPY (EGD) WITH PROPOFOL  N/A 08/30/2019   Procedure: ESOPHAGOGASTRODUODENOSCOPY (EGD) WITH PROPOFOL ;  Surgeon: Selena Daily, MD;  Location: [facility] ENDOSCOPY;  Service: Gastroenterology;  Laterality: N/A;   Social History:  reports that she has never smoked. She has never used smokeless tobacco. She reports that she does not currently use alcohol. She reports that she does not currently use drugs.  No Known Allergies  Family History  Problem Relation Age of Onset   Breast cancer Neg Hx     Prior to Admission medications  Medication Sig Start Date End Date Taking? Authorizing Provider  acetaminophen  (TYLENOL ) 500 MG tablet Take 1,000 mg by mouth daily as needed (arthritis pain).   Yes [provider]  atorvastatin  (LIPITOR) 40 MG tablet Take 1 tablet (40 mg total) by mouth daily. 07/04/23  Yes Amin, Sumayya, MD  cyanocobalamin (VITAMIN B12) 1000 MCG  tablet Take 1 tablet (1,000 mcg total) by mouth daily. 07/03/23  Yes Amin, Sumayya, MD  Ergocalciferol (VITAMIN D2) 10 MCG (400 UNIT) TABS Take 400 Units by mouth daily.   Yes [provider]  ferrous sulfate 325 (65 FE) MG EC tablet Take 325 mg by mouth as directed. Take 1 tablet 3 days a week.   Yes [provider]  folic acid  (FOLVITE ) 400 MCG tablet Take 400 mcg by mouth daily.   Yes [provider]  losartan (COZAAR) 25 MG tablet Take 1 tablet (25 mg total) by mouth daily. 07/03/23  Yes Amin, Sumayya, MD  Magnesium 250 MG TABS Take 250 mg by mouth daily.   Yes [provider]  propranolol ER (INDERAL LA) 60 MG 24 hr capsule Take 60 mg by mouth daily. 03/03/23  Yes [provider]  RABEprazole (ACIPHEX) 20 MG tablet Take 20 mg by mouth 2 (two) times daily. 08/21/19  Yes [provider]    Physical Exam: Vitals:   07/03/23 2151 07/04/23 0100 07/04/23 0130 07/04/23 0200  BP: (!) 152/74 (!) 172/72 (!) 151/79 (!) 132/58  Pulse: 81 79 81 79  Resp: 18 10 14 11   Temp: 97.7 F (36.5 C)     TempSrc: Oral     SpO2: 95% 99% 98% 97%   Physical Exam Vitals and nursing note reviewed.  Constitutional:      General: She is not in acute distress. HENT:     Head: Normocephalic and atraumatic.  Cardiovascular:     Rate and Rhythm: Normal rate and regular rhythm.     Heart sounds: Normal heart sounds.  Pulmonary:     Effort: Pulmonary effort is normal.     Breath sounds: Normal breath sounds.  Abdominal:     Palpations: Abdomen is soft.     Tenderness: There is no abdominal tenderness.  Neurological:     Mental Status: Mental status is at baseline.     Labs on Admission: I have personally reviewed following labs and imaging studies  CBC: Recent Labs  Lab 07/02/23 2225 07/03/23 2157  WBC 7.1 7.6  NEUTROABS 2.6  --   HGB 10.4* 10.4*  HCT 32.2* 32.0*  MCV 90.7 89.4  PLT 206 196   Basic Metabolic Panel: Recent Labs  Lab  07/02/23 2225 07/03/23 2157  NA 137 133*  K 4.2 3.6  CL 103 104  CO2 24 22  GLUCOSE 93 104*  BUN 25* 22  CREATININE 1.29* 1.16*  CALCIUM  9.5 8.6*   GFR: Estimated Creatinine Clearance: 29.5 mL/min (A) (by C-G formula based on SCr of 1.16 mg/dL (H)). Liver Function Tests: Recent Labs  Lab 07/02/23 2225 07/03/23 2157  AST 19 21  ALT 11 11  ALKPHOS 64 57  BILITOT 0.5 0.8  PROT 6.8 6.8  ALBUMIN 4.1 4.0   No results for input(s): "LIPASE", "AMYLASE" in the last 168 hours. No results for input(s): "AMMONIA" in the last 168 hours. Coagulation Profile: Recent Labs  Lab 07/02/23 2225  INR 1.0   Cardiac Enzymes: No results for input(s): "CKTOTAL", "CKMB", "CKMBINDEX", "TROPONINI" in the last 168 hours. BNP (last 3 results) No  results for input(s): "PROBNP" in the last 8760 hours. HbA1C: Recent Labs    07/03/23 0037  HGBA1C 5.2   CBG: Recent Labs  Lab 07/02/23 2211  GLUCAP 88   Lipid Profile: Recent Labs    07/03/23 0506  CHOL 228*  HDL 60  LDLCALC 156*  TRIG 59  CHOLHDL 3.8   Thyroid  Function Tests: No results for input(s): "TSH", "T4TOTAL", "FREET4", "T3FREE", "THYROIDAB" in the last 72 hours. Anemia Panel: Recent Labs    07/03/23 0037  VITAMINB12 238  FOLATE 38.0  FERRITIN 91  TIBC 354  IRON 36  RETICCTPCT 1.2   Urine analysis:    Component Value Date/Time   COLORURINE YELLOW (A) 11/21/2017 0731   APPEARANCEUR Clear 10/12/2019 0844   LABSPEC 1.014 11/21/2017 0731   PHURINE 6.0 11/21/2017 0731   GLUCOSEU Negative 10/12/2019 0844   HGBUR SMALL (A) 11/21/2017 0731   BILIRUBINUR Negative 10/12/2019 0844   KETONESUR 5 (A) 11/21/2017 0731   PROTEINUR Negative 10/12/2019 0844   PROTEINUR NEGATIVE 11/21/2017 0731   NITRITE Negative 10/12/2019 0844   NITRITE NEGATIVE 11/21/2017 0731   LEUKOCYTESUR Negative 10/12/2019 0844    Radiological Exams on Admission: DG Chest Port 1 View Result Date: 07/04/2023 CLINICAL DATA:  Weakness and subsequent  fall. EXAM: PORTABLE CHEST 1 VIEW COMPARISON:  October 04, 2014 FINDINGS: The heart size and mediastinal contours are within normal limits. Moderate severity calcification of the aortic arch is noted. The lungs are mildly hyperinflated. Mild areas of scarring and/or atelectasis are seen within the bilateral apices. No acute infiltrate, pleural effusion or pneumothorax is identified. The visualized skeletal structures are unremarkable. IMPRESSION: No active cardiopulmonary disease. Electronically Signed   By: Virgle Grime M.D.   On: 07/04/2023 01:36   ECHOCARDIOGRAM COMPLETE Result Date: 07/03/2023    ECHOCARDIOGRAM REPORT   Patient Name:   [facility]anti [facility] Date of Exam: 07/03/2023 Medical Rec #:  [id number]       Height:       64.0 in Accession #:    [id number]      Weight:       123.0 lb Date of Birth:  Sep 02, 1933       BSA:          1.591 m Patient Age:    89 years        BP:           150/60 mmHg Patient Gender: F               HR:           73 bpm. Exam Location:  [facility] Procedure: 2D Echo (Both Spectral and Color Flow Doppler were utilized during            procedure). Indications:     stroke  History:         Patient has prior history of Echocardiogram examinations, most                  recent 11/21/2017. Chronic kidney disease,                  Arrythmias:paroxysmal a-fib; Risk Factors:Hypertension and                  Dyslipidemia.  Sonographer:     Dione Franks RDCS Referring Phys:  [id number] Lanetta Pion Diagnosing Phys: Constancia Delton MD IMPRESSIONS  1. Left ventricular ejection fraction, by estimation, is 60 to 65%. The left ventricle has normal function. The left  ventricle has no regional wall motion abnormalities. Left ventricular diastolic parameters were normal.  2. Right ventricular systolic function is normal. The right ventricular size is normal. There is normal pulmonary artery systolic pressure.  3. The mitral valve is normal in structure. Mild mitral valve regurgitation.  4. The  aortic valve is tricuspid. Aortic valve regurgitation is not visualized.  5. The inferior vena cava is normal in size with greater than 50% respiratory variability, suggesting right atrial pressure of 3 mmHg. FINDINGS  Left Ventricle: Left ventricular ejection fraction, by estimation, is 60 to 65%. The left ventricle has normal function. The left ventricle has no regional wall motion abnormalities. The left ventricular internal cavity size was normal in size. There is  no left ventricular hypertrophy. Left ventricular diastolic parameters were normal. Right Ventricle: The right ventricular size is normal. No increase in right ventricular wall thickness. Right ventricular systolic function is normal. There is normal pulmonary artery systolic pressure. The tricuspid regurgitant velocity is 2.25 m/s, and  with an assumed right atrial pressure of 3 mmHg, the estimated right ventricular systolic pressure is 23.2 mmHg. Left Atrium: Left atrial size was normal in size. Right Atrium: Right atrial size was normal in size. Pericardium: There is no evidence of pericardial effusion. Mitral Valve: The mitral valve is normal in structure. Mild mitral valve regurgitation. Tricuspid Valve: The tricuspid valve is normal in structure. Tricuspid valve regurgitation is mild. Aortic Valve: The aortic valve is tricuspid. Aortic valve regurgitation is not visualized. Pulmonic Valve: The pulmonic valve was normal in structure. Pulmonic valve regurgitation is mild. Aorta: The aortic root is normal in size and structure. Venous: The inferior vena cava is normal in size with greater than 50% respiratory variability, suggesting right atrial pressure of 3 mmHg. IAS/Shunts: No atrial level shunt detected by color flow Doppler.  LEFT VENTRICLE PLAX 2D LVIDd:         4.10 cm   Diastology LVIDs:         2.60 cm   LV e' medial:    7.40 cm/s LV PW:         0.80 cm   LV E/e' medial:  14.1 LV IVS:        0.80 cm   LV e' lateral:   10.30 cm/s LVOT diam:      1.70 cm   LV E/e' lateral: 10.1 LV SV:         52 LV SV Index:   33 LVOT Area:     2.27 cm  RIGHT VENTRICLE             IVC RV Basal diam:  2.30 cm     IVC diam: 1.00 cm RV S prime:     15.00 cm/s TAPSE (M-mode): 2.1 cm LEFT ATRIUM             Index        RIGHT ATRIUM          Index LA diam:        3.00 cm 1.89 cm/m   RA Area:     9.78 cm LA Vol (A2C):   29.1 ml 18.29 ml/m  RA Volume:   20.70 ml 13.01 ml/m LA Vol (A4C):   34.8 ml 21.87 ml/m LA Biplane Vol: 33.3 ml 20.93 ml/m  AORTIC VALVE LVOT Vmax:   95.30 cm/s LVOT Vmean:  64.400 cm/s LVOT VTI:    0.228 m  AORTA Ao Root diam: 2.90 cm Ao Asc diam:  3.20 cm  MITRAL VALVE                TRICUSPID VALVE MV Area (PHT): 3.85 cm     TR Peak grad:   20.2 mmHg MV Decel Time: 197 msec     TR Vmax:        225.00 cm/s MV E velocity: 104.00 cm/s MV A velocity: 108.00 cm/s  SHUNTS MV E/A ratio:  0.96         Systemic VTI:  0.23 m                             Systemic Diam: 1.70 cm Constancia Delton MD Electronically signed by Constancia Delton MD Signature Date/Time: 07/03/2023/4:46:01 PM    Final    MR BRAIN WO CONTRAST Result Date: 07/03/2023 CLINICAL DATA:  Initial evaluation for acute headache, dizziness. EXAM: MRI HEAD WITHOUT CONTRAST TECHNIQUE: Multiplanar, multiecho pulse sequences of the brain and surrounding structures were obtained without intravenous contrast. COMPARISON:  CT from 07/02/2023 FINDINGS: Brain: Cerebral volume within normal limits. Patchy T2/FLAIR hyperintensity involving the supratentorial cerebral white matter, most characteristic of chronic microvascular ischemic disease, minor for age. No evidence for acute or subacute infarct. No areas of chronic cortical infarction. No acute or chronic intracranial blood products. No mass lesion, midline shift or mass effect no hydrocephalus or extra-axial fluid collection. Pituitary gland within normal limits. Vascular: Major intracranial vascular flow voids are maintained. Skull and upper cervical  spine: Cranial junction within normal limits. Bone marrow signal intensity within normal limits. No scalp soft tissue abnormality. Apparent focus of FLAIR signal intensity along the midline at the high anterior scalp noted, felt to be consistent with artifact. Sinuses/Orbits: Prior bilateral ocular lens replacement. Paranasal sinuses are largely clear. No significant mastoid effusion. Other: None. IMPRESSION: 1. No acute intracranial abnormality. 2. Mild chronic microvascular ischemic disease for age. Electronically Signed   By: Virgia Griffins M.D.   On: 07/03/2023 01:37   CT HEAD CODE STROKE WO CONTRAST Result Date: 07/02/2023 CLINICAL DATA:  Code stroke. Initial evaluation for neuro deficit, stroke suspected. EXAM: CT HEAD WITHOUT CONTRAST TECHNIQUE: Contiguous axial images were obtained from the base of the skull through the vertex without intravenous contrast. RADIATION DOSE REDUCTION: This exam was performed according to the departmental dose-optimization program which includes automated exposure control, adjustment of the mA and/or kV according to patient size and/or use of iterative reconstruction technique. COMPARISON:  Prior study from 11/21/2017 FINDINGS: Brain: Cerebral volume within normal limits. No acute intracranial hemorrhage. No acute large vessel territory infarct. No mass lesion or midline shift. No hydrocephalus or extra-axial fluid collection. Vascular: No abnormal hyperdense vessel. Skull: Scalp soft tissues within normal limits for age calvarium intact. Sinuses/Orbits: Globes orbital soft tissues demonstrate no acute finding. Paranasal sinuses and mastoid air cells are largely clear. Other: None. ASPECTS [facility] Stroke Program Early CT Score) - Ganglionic level infarction (caudate, lentiform nuclei, internal capsule, insula, M1-M3 cortex): 7 - Supraganglionic infarction (M4-M6 cortex): 3 Total score (0-10 with 10 being normal): 10 IMPRESSION: 1. Negative head CT.  No acute  intracranial abnormality 2. ASPECTS is 10. Results were called by telephone at the time of interpretation on 07/02/2023 at 10:28 pm to provider Dr. Hendrick Locke, who verbally acknowledged these results. Electronically Signed   By: Virgia Griffins M.D.   On: 07/02/2023 22:29   Data Reviewed for HPI: Relevant notes from primary care and specialist visits, past discharge summaries as available in EHR,  including Care Everywhere. Prior diagnostic testing as pertinent to current admission diagnoses Updated medications and problem lists for reconciliation ED course, including vitals, labs, imaging, treatment and response to treatment Triage notes, nursing and pharmacy notes and ED provider's notes Notable results as noted above in HPI      Assessment and Plan: * NSTEMI (non-ST elevated myocardial infarction) (HCC) Myocardial injury, possibly related to ACS versus demand ischemia from recent hypertensive urgency Patient presents with weakness and a fall, troponin bump 338--1007, lateral ST depressions Patient denies chest pain Received chewable aspirin  in the ED Continue heparin infusion Continue atorvastatin .  Will add aspirin .  Will replace home propranolol with metoprolol Will keep n.p.o. in case of procedure Cardiology consulted  Discussed plan with daughter at bedside  Essential hypertension Hypertensive encephalopathy 5/21 with visual disturbance, ongoing Blood pressure currently controlled Continue home losartan  Fall at home, initial encounter PT OT consult Fall precautions  Stage 3a chronic kidney disease (HCC) Renal function at baseline  Anemia Stable  PAF (paroxysmal atrial fibrillation) (HCC) On aspirin  Replacing home propranolol with metoprolol     DVT prophylaxis: heparin  Consults: [facility] cardiology, Dr. Beau Bound  Advance Care Planning:   Code Status: Prior   Family Communication: Daughter at bedside  Disposition Plan: Back to previous home  environment  Severity of Illness: The appropriate patient status for this patient is OBSERVATION. Observation status is judged to be reasonable and necessary in order to provide the required intensity of service to ensure the patient's safety. The patient's presenting symptoms, physical exam findings, and initial radiographic and laboratory data in the context of their medical condition is felt to place them at decreased risk for further clinical deterioration. Furthermore, it is anticipated that the patient will be medically stable for discharge from the hospital within 2 midnights of admission.   Author: Lanetta Pion, MD 07/04/2023 2:13 AM  For on call review [email or web address].

## 2023-07-04 NOTE — Assessment & Plan Note (Signed)
 Hypertensive encephalopathy 5/21 with visual disturbance, ongoing Blood pressure currently controlled Continue home losartan

## 2023-07-04 NOTE — Progress Notes (Signed)
 PHARMACY - ANTICOAGULATION CONSULT NOTE  Pharmacy Consult for Heparin  Indication: chest pain/ACS  No Known Allergies  Patient Measurements:    Vital Signs: Temp: 97.7 F (36.5 C) (05/22 2151) Temp Source: Oral (05/22 2151) BP: 172/72 (05/23 0100) Pulse Rate: 79 (05/23 0100)  Labs: Recent Labs    07/02/23 2225 07/03/23 0037 07/03/23 1022 07/03/23 2157  HGB 10.4*  --   --  10.4*  HCT 32.2*  --   --  32.0*  PLT 206  --   --  196  APTT 37*  --   --   --   LABPROT 13.1  --   --   --   INR 1.0  --   --   --   CREATININE 1.29*  --   --  1.16*  TROPONINIHS 33* 95* 338* 1,007*    Estimated Creatinine Clearance: 29.5 mL/min (A) (by C-G formula based on SCr of 1.16 mg/dL (H)).   Medical History: Past Medical History:  Diagnosis Date   AF (atrial fibrillation) (HCC)    Cancer (HCC)    Dysrhythmia    A FIB   GERD (gastroesophageal reflux disease)    HOH (hard of hearing)    PAF (paroxysmal atrial fibrillation) (HCC)    Palpitations    Pulmonary nodules     Medications:  (Not in a hospital admission)   Assessment: Pharmacy consulted to dose heparin in this 89 year old female admitted with ACS/NSTEMI.  No prior anticoag noted. CrCl = 29.5 ml/min   Goal of Therapy:  Heparin level 0.3-0.7 units/ml Monitor platelets by anticoagulation protocol: Yes   Plan:  Give 3350 units bolus x 1 Start heparin infusion at 650 units/hr Check anti-Xa level in 8 hours and daily while on heparin Continue to monitor H&H and platelets  Jaaziah Schulke D 07/04/2023,1:14 AM

## 2023-07-04 NOTE — Care Management Obs Status (Signed)
 MEDICARE OBSERVATION STATUS NOTIFICATION   Patient Details  Name: SHAKITA KEIR MRN: [id number] Date of Birth: 27-Oct-1933   Medicare Observation Status Notification Given:  Yes    Nellene Banana Devery Murgia, RN 07/04/2023, 10:04 AM

## 2023-07-04 NOTE — Assessment & Plan Note (Signed)
 On aspirin  Replacing home propranolol with metoprolol

## 2023-07-04 NOTE — Assessment & Plan Note (Signed)
 PT OT consult  Fall precautions

## 2023-07-04 NOTE — ED Notes (Signed)
 Pt is eating her meal tray, no distress.  Sister is here and will drive pt home

## 2023-07-05 DIAGNOSIS — R7989 Other specified abnormal findings of blood chemistry: Secondary | ICD-10-CM | POA: Insufficient documentation

## 2023-07-08 LAB — LIPOPROTEIN A (LPA): Lipoprotein (a): <8.4 nmol/L (ref <75.0)

## 2023-07-09 DIAGNOSIS — E7800 Pure hypercholesterolemia, unspecified: Secondary | ICD-10-CM | POA: Diagnosis not present

## 2023-07-09 DIAGNOSIS — R42 Dizziness and giddiness: Secondary | ICD-10-CM | POA: Diagnosis not present

## 2023-07-09 DIAGNOSIS — I10 Essential (primary) hypertension: Secondary | ICD-10-CM | POA: Diagnosis not present

## 2023-07-09 DIAGNOSIS — Z09 Encounter for follow-up examination after completed treatment for conditions other than malignant neoplasm: Secondary | ICD-10-CM | POA: Diagnosis not present

## 2023-07-09 DIAGNOSIS — R441 Visual hallucinations: Secondary | ICD-10-CM | POA: Diagnosis not present

## 2023-07-17 DIAGNOSIS — G43109 Migraine with aura, not intractable, without status migrainosus: Secondary | ICD-10-CM | POA: Diagnosis not present

## 2023-07-17 DIAGNOSIS — H43813 Vitreous degeneration, bilateral: Secondary | ICD-10-CM | POA: Diagnosis not present

## 2023-07-17 DIAGNOSIS — Z961 Presence of intraocular lens: Secondary | ICD-10-CM | POA: Diagnosis not present

## 2023-07-21 DIAGNOSIS — I10 Essential (primary) hypertension: Secondary | ICD-10-CM | POA: Diagnosis not present

## 2023-07-21 DIAGNOSIS — E7800 Pure hypercholesterolemia, unspecified: Secondary | ICD-10-CM | POA: Diagnosis not present

## 2023-07-21 DIAGNOSIS — I480 Paroxysmal atrial fibrillation: Secondary | ICD-10-CM | POA: Diagnosis not present

## 2023-08-06 ENCOUNTER — Other Ambulatory Visit: Payer: Self-pay | Admitting: Internal Medicine

## 2023-08-06 DIAGNOSIS — Z1231 Encounter for screening mammogram for malignant neoplasm of breast: Secondary | ICD-10-CM

## 2023-08-19 DIAGNOSIS — R531 Weakness: Secondary | ICD-10-CM | POA: Diagnosis not present

## 2023-08-19 DIAGNOSIS — Z79899 Other long term (current) drug therapy: Secondary | ICD-10-CM | POA: Diagnosis not present

## 2023-08-19 DIAGNOSIS — N1831 Chronic kidney disease, stage 3a: Secondary | ICD-10-CM | POA: Diagnosis not present

## 2023-08-19 DIAGNOSIS — R42 Dizziness and giddiness: Secondary | ICD-10-CM | POA: Diagnosis not present

## 2023-08-19 DIAGNOSIS — E7800 Pure hypercholesterolemia, unspecified: Secondary | ICD-10-CM | POA: Diagnosis not present

## 2023-08-19 DIAGNOSIS — K219 Gastro-esophageal reflux disease without esophagitis: Secondary | ICD-10-CM | POA: Diagnosis not present

## 2023-08-19 DIAGNOSIS — I129 Hypertensive chronic kidney disease with stage 1 through stage 4 chronic kidney disease, or unspecified chronic kidney disease: Secondary | ICD-10-CM | POA: Diagnosis not present

## 2023-08-19 DIAGNOSIS — Z1231 Encounter for screening mammogram for malignant neoplasm of breast: Secondary | ICD-10-CM | POA: Diagnosis not present

## 2023-08-19 DIAGNOSIS — D508 Other iron deficiency anemias: Secondary | ICD-10-CM | POA: Diagnosis not present

## 2023-08-20 DIAGNOSIS — I10 Essential (primary) hypertension: Secondary | ICD-10-CM | POA: Diagnosis not present

## 2023-08-25 ENCOUNTER — Ambulatory Visit
Admission: RE | Admit: 2023-08-25 | Discharge: 2023-08-25 | Disposition: A | Discharge status: Home / Self Care | Source: Ambulatory Visit | Attending: Internal Medicine | Admitting: Internal Medicine

## 2023-08-25 DIAGNOSIS — Z1231 Encounter for screening mammogram for malignant neoplasm of breast: Secondary | ICD-10-CM | POA: Diagnosis not present

## 2023-08-29 ENCOUNTER — Other Ambulatory Visit: Payer: Self-pay | Admitting: Internal Medicine

## 2023-08-29 DIAGNOSIS — R928 Other abnormal and inconclusive findings on diagnostic imaging of breast: Secondary | ICD-10-CM

## 2023-09-02 ENCOUNTER — Ambulatory Visit
Admission: RE | Admit: 2023-09-02 | Discharge: 2023-09-02 | Disposition: A | Discharge status: Home / Self Care | Source: Ambulatory Visit | Attending: Internal Medicine | Admitting: Internal Medicine

## 2023-09-02 ENCOUNTER — Inpatient Hospital Stay
Admission: RE | Admit: 2023-09-02 | Discharge: 2023-09-02 | Discharge status: Home / Self Care | Source: Ambulatory Visit | Attending: Internal Medicine | Admitting: Internal Medicine

## 2023-09-02 DIAGNOSIS — R928 Other abnormal and inconclusive findings on diagnostic imaging of breast: Secondary | ICD-10-CM | POA: Diagnosis not present

## 2023-09-02 DIAGNOSIS — R92333 Mammographic heterogeneous density, bilateral breasts: Secondary | ICD-10-CM | POA: Diagnosis not present

## 2023-10-22 DIAGNOSIS — E7800 Pure hypercholesterolemia, unspecified: Secondary | ICD-10-CM | POA: Diagnosis not present

## 2023-10-22 DIAGNOSIS — I10 Essential (primary) hypertension: Secondary | ICD-10-CM | POA: Diagnosis not present

## 2023-10-22 DIAGNOSIS — I480 Paroxysmal atrial fibrillation: Secondary | ICD-10-CM | POA: Diagnosis not present

## 2023-11-20 DIAGNOSIS — J309 Allergic rhinitis, unspecified: Secondary | ICD-10-CM | POA: Diagnosis not present

## 2023-11-20 DIAGNOSIS — F439 Reaction to severe stress, unspecified: Secondary | ICD-10-CM | POA: Diagnosis not present

## 2023-11-20 DIAGNOSIS — I674 Hypertensive encephalopathy: Secondary | ICD-10-CM | POA: Diagnosis not present

## 2023-11-20 DIAGNOSIS — I10 Essential (primary) hypertension: Secondary | ICD-10-CM | POA: Diagnosis not present

## 2023-12-08 DIAGNOSIS — Z79899 Other long term (current) drug therapy: Secondary | ICD-10-CM | POA: Diagnosis not present

## 2023-12-08 DIAGNOSIS — Z1331 Encounter for screening for depression: Secondary | ICD-10-CM | POA: Diagnosis not present

## 2023-12-08 DIAGNOSIS — K219 Gastro-esophageal reflux disease without esophagitis: Secondary | ICD-10-CM | POA: Diagnosis not present

## 2023-12-08 DIAGNOSIS — E7800 Pure hypercholesterolemia, unspecified: Secondary | ICD-10-CM | POA: Diagnosis not present

## 2023-12-08 DIAGNOSIS — I10 Essential (primary) hypertension: Secondary | ICD-10-CM | POA: Diagnosis not present

## 2023-12-08 DIAGNOSIS — Z0000 Encounter for general adult medical examination without abnormal findings: Secondary | ICD-10-CM | POA: Diagnosis not present

## 2023-12-17 DIAGNOSIS — H43813 Vitreous degeneration, bilateral: Secondary | ICD-10-CM | POA: Diagnosis not present

## 2023-12-17 DIAGNOSIS — G43109 Migraine with aura, not intractable, without status migrainosus: Secondary | ICD-10-CM | POA: Diagnosis not present

## 2023-12-17 DIAGNOSIS — H11001 Unspecified pterygium of right eye: Secondary | ICD-10-CM | POA: Diagnosis not present

## 2023-12-17 DIAGNOSIS — Z961 Presence of intraocular lens: Secondary | ICD-10-CM | POA: Diagnosis not present

## 2023-12-26 DIAGNOSIS — Z87891 Personal history of nicotine dependence: Secondary | ICD-10-CM | POA: Diagnosis not present

## 2023-12-26 DIAGNOSIS — I129 Hypertensive chronic kidney disease with stage 1 through stage 4 chronic kidney disease, or unspecified chronic kidney disease: Secondary | ICD-10-CM | POA: Diagnosis not present

## 2023-12-26 DIAGNOSIS — E7800 Pure hypercholesterolemia, unspecified: Secondary | ICD-10-CM | POA: Diagnosis not present

## 2023-12-26 DIAGNOSIS — N1831 Chronic kidney disease, stage 3a: Secondary | ICD-10-CM | POA: Diagnosis not present

## 2023-12-26 DIAGNOSIS — Z79899 Other long term (current) drug therapy: Secondary | ICD-10-CM | POA: Diagnosis not present
